# Patient Record
Sex: Female | Born: 1990 | Race: White | Hispanic: No | Marital: Single | Smoking: Current every day smoker
Health system: Southern US, Community
[De-identification: ages and names within clinical notes are randomized; demographics above are authoritative.]

## PROBLEM LIST (undated history)

## (undated) DIAGNOSIS — N83209 Unspecified ovarian cyst, unspecified side: Secondary | ICD-10-CM

---

## 2015-02-12 ENCOUNTER — Emergency Department (HOSPITAL_COMMUNITY): Payer: Managed Care, Other (non HMO)

## 2015-02-12 ENCOUNTER — Encounter (HOSPITAL_COMMUNITY): Payer: Self-pay | Admitting: Emergency Medicine

## 2015-02-12 ENCOUNTER — Emergency Department (HOSPITAL_COMMUNITY)
Admission: EM | Admit: 2015-02-12 | Discharge: 2015-02-12 | Disposition: A | Discharge status: Home / Self Care | Payer: Managed Care, Other (non HMO) | Attending: Emergency Medicine | Admitting: Emergency Medicine

## 2015-02-12 DIAGNOSIS — F17210 Nicotine dependence, cigarettes, uncomplicated: Secondary | ICD-10-CM | POA: Insufficient documentation

## 2015-02-12 DIAGNOSIS — N83201 Unspecified ovarian cyst, right side: Secondary | ICD-10-CM

## 2015-02-12 DIAGNOSIS — N83202 Unspecified ovarian cyst, left side: Secondary | ICD-10-CM

## 2015-02-12 DIAGNOSIS — O34591 Maternal care for other abnormalities of gravid uterus, first trimester: Secondary | ICD-10-CM | POA: Insufficient documentation

## 2015-02-12 DIAGNOSIS — Z3200 Encounter for pregnancy test, result unknown: Secondary | ICD-10-CM

## 2015-02-12 DIAGNOSIS — Z3A01 Less than 8 weeks gestation of pregnancy: Secondary | ICD-10-CM | POA: Diagnosis not present

## 2015-02-12 DIAGNOSIS — N8320 Unspecified ovarian cysts: Secondary | ICD-10-CM | POA: Insufficient documentation

## 2015-02-12 DIAGNOSIS — R102 Pelvic and perineal pain: Secondary | ICD-10-CM

## 2015-02-12 DIAGNOSIS — Z79899 Other long term (current) drug therapy: Secondary | ICD-10-CM | POA: Insufficient documentation

## 2015-02-12 DIAGNOSIS — O99331 Smoking (tobacco) complicating pregnancy, first trimester: Secondary | ICD-10-CM | POA: Insufficient documentation

## 2015-02-12 DIAGNOSIS — O9989 Other specified diseases and conditions complicating pregnancy, childbirth and the puerperium: Secondary | ICD-10-CM | POA: Insufficient documentation

## 2015-02-12 HISTORY — DX: Unspecified ovarian cyst, unspecified side: N83.209

## 2015-02-12 LAB — URINALYSIS, ROUTINE W REFLEX MICROSCOPIC
Bilirubin Urine: NEGATIVE
Glucose, UA: NEGATIVE mg/dL
Hgb urine dipstick: NEGATIVE
Ketones, ur: NEGATIVE mg/dL
Leukocytes, UA: NEGATIVE
Nitrite: NEGATIVE
Protein, ur: NEGATIVE mg/dL
Specific Gravity, Urine: 1.022 (ref 1.005–1.030)
Urobilinogen, UA: 0.2 mg/dL (ref 0.0–1.0)
pH: 8 (ref 5.0–8.0)

## 2015-02-12 LAB — CBC WITH DIFFERENTIAL/PLATELET
Basophils Absolute: 0 10*3/uL (ref 0.0–0.1)
Basophils Relative: 0 % (ref 0–1)
Eosinophils Absolute: 0.3 10*3/uL (ref 0.0–0.7)
Eosinophils Relative: 4 % (ref 0–5)
HCT: 40.1 % (ref 36.0–46.0)
Hemoglobin: 13.3 g/dL (ref 12.0–15.0)
Lymphocytes Relative: 30 % (ref 12–46)
Lymphs Abs: 2.2 10*3/uL (ref 0.7–4.0)
MCH: 31.4 pg (ref 26.0–34.0)
MCHC: 33.2 g/dL (ref 30.0–36.0)
MCV: 94.6 fL (ref 78.0–100.0)
Monocytes Absolute: 0.5 10*3/uL (ref 0.1–1.0)
Monocytes Relative: 6 % (ref 3–12)
Neutro Abs: 4.4 10*3/uL (ref 1.7–7.7)
Neutrophils Relative %: 60 % (ref 43–77)
Platelets: 203 10*3/uL (ref 150–400)
RBC: 4.24 MIL/uL (ref 3.87–5.11)
RDW: 12.8 % (ref 11.5–15.5)
WBC: 7.4 10*3/uL (ref 4.0–10.5)

## 2015-02-12 LAB — POC URINE PREG, ED: Preg Test, Ur: POSITIVE — AB

## 2015-02-12 LAB — COMPREHENSIVE METABOLIC PANEL
ALT: 19 U/L (ref 0–35)
AST: 17 U/L (ref 0–37)
Albumin: 4.4 g/dL (ref 3.5–5.2)
Alkaline Phosphatase: 46 U/L (ref 39–117)
Anion gap: 4 — ABNORMAL LOW (ref 5–15)
BUN: 13 mg/dL (ref 6–23)
CO2: 28 mmol/L (ref 19–32)
Calcium: 8.8 mg/dL (ref 8.4–10.5)
Chloride: 108 mmol/L (ref 96–112)
Creatinine, Ser: 0.51 mg/dL (ref 0.50–1.10)
GFR calc Af Amer: >90 mL/min (ref >90)
GFR calc non Af Amer: >90 mL/min (ref >90)
Glucose, Bld: 103 mg/dL — ABNORMAL HIGH (ref 70–99)
Potassium: 4.2 mmol/L (ref 3.5–5.1)
Sodium: 140 mmol/L (ref 135–145)
Total Bilirubin: 1.1 mg/dL (ref 0.3–1.2)
Total Protein: 6.8 g/dL (ref 6.0–8.3)

## 2015-02-12 LAB — WET PREP, GENITAL
Clue Cells Wet Prep HPF POC: NONE SEEN
Trich, Wet Prep: NONE SEEN
Yeast Wet Prep HPF POC: NONE SEEN

## 2015-02-12 LAB — HCG, QUANTITATIVE, PREGNANCY

## 2015-02-12 LAB — I-STAT BETA HCG BLOOD, ED (MC, WL, AP ONLY): I-stat hCG, quantitative: <5 m[IU]/mL (ref <5)

## 2015-02-12 LAB — LIPASE, BLOOD: Lipase: 25 U/L (ref 11–59)

## 2015-02-12 MED ORDER — HYDROCODONE-ACETAMINOPHEN 5-325 MG PO TABS: 1.0000 | ORAL_TABLET | Freq: Four times a day (QID) | ORAL | Status: DC | PRN

## 2015-02-12 MED ORDER — HYDROCODONE-ACETAMINOPHEN 5-325 MG PO TABS
1.0000 | ORAL_TABLET | Freq: Once | ORAL | Status: AC
  Administered 2015-02-12: 1 via ORAL
  Filled 2015-02-12: qty 1

## 2015-02-12 MED ORDER — METOCLOPRAMIDE HCL 5 MG/ML IJ SOLN
10.0000 mg | Freq: Once | INTRAMUSCULAR | Status: AC
  Administered 2015-02-12: 10 mg via INTRAMUSCULAR
  Filled 2015-02-12: qty 2

## 2015-02-12 NOTE — ED Notes (Signed)
Patient is crying, anxious, and nervous. She is feels overwhelmed about her current condition.

## 2015-02-12 NOTE — ED Notes (Signed)
Patient Dxed with ovarian cysts, c/o low abdomen pain without relief from prescribed medication.

## 2015-02-12 NOTE — ED Notes (Signed)
Patients family member has come up to the nursing desk concerning of when provider was going to be reassess patient. Informed him that the provider was still in another room suturing.

## 2015-02-12 NOTE — ED Notes (Signed)
Patient called out reporting she is vomiting. Notified provider. She would like to look at ultrasound results before she medicates. Will inform patient.

## 2015-02-12 NOTE — ED Notes (Signed)
Ultrasound at bedside

## 2015-02-12 NOTE — ED Provider Notes (Signed)
CSN: [id number]     Arrival date & time 02/12/15  1418 History   First MD Initiated Contact with Patient 02/12/15 1653     Chief Complaint  Patient presents with  . Abdominal Pain     (Consider location/radiation/quality/duration/timing/severity/associated sxs/prior Treatment) The history is provided by the patient. No language interpreter was used.  Heidi Peck is a 24 year old female with past medical history ovarian cysts presenting to the ED with abdominal pain that has been ongoing for the past 2-3 months with worsening over the past 2 weeks. Patient reported that in December 2015 she had an ultrasound performed that identified cysts bilaterally, patient was prescribed naproxen 500 mg and was recommended to come back in 4 months for reassessment-reported ultrasound was performed in [facility]. Patient reported that for over the past 2 weeks her right pelvic region hurts more than her left. Reported that the pain varies as a sharp, burning sensation with radiation towards the back. Reported intermittent feelings of nausea. Reported that her last menstrual. 12/23/2014, reported that she is 2 weeks late and normally has irregular cycles. Denied birth control. Reported that she is sexually active, reported using protection-condoms. Denied fever, chills, chest pain, shortness of breath, difficulty breathing, vomiting, vaginal discharge, vaginal bleeding, dizziness, travels, dizziness, headaches, changes to eating habits. PCP none  Past Medical History  Diagnosis Date  . Ovarian cyst    History reviewed. No pertinent past surgical history. No family history on file. History  Substance Use Topics  . Smoking status: Current Every Day Smoker    Types: Cigarettes  . Smokeless tobacco: Not on file  . Alcohol Use: Yes     Comment: occasional   OB History    No data available     Review of Systems  Constitutional: Negative for fever and chills.  Respiratory: Negative for  chest tightness and shortness of breath.   Cardiovascular: Negative for chest pain.  Gastrointestinal: Positive for nausea. Negative for vomiting, abdominal pain, diarrhea, constipation, blood in stool and anal bleeding.  Genitourinary: Positive for pelvic pain. Negative for dysuria, hematuria, vaginal bleeding, vaginal discharge and vaginal pain.  Musculoskeletal: Positive for back pain. Negative for neck pain and neck stiffness.  Neurological: Negative for dizziness, weakness and headaches.      Allergies  Sulfa antibiotics  Home Medications   Prior to Admission medications   Medication Sig Start Date End Date Taking? Authorizing Provider  citalopram (CELEXA) 40 MG tablet Take 40 mg by mouth daily.   Yes Historical Provider, MD  clonazePAM (KLONOPIN) 0.5 MG tablet Take 0.25 mg by mouth 2 (two) times daily as needed for anxiety (anxiety).   Yes Historical Provider, MD  Multiple Vitamins-Minerals (MULTIVITAMIN & MINERAL PO) Take 1 tablet by mouth daily.   Yes Historical Provider, MD  naproxen (NAPROSYN) 500 MG tablet Take 500 mg by mouth every 12 (twelve) hours.   Yes Historical Provider, MD  HYDROcodone-acetaminophen (NORCO/VICODIN) 5-325 MG per tablet Take 1 tablet by mouth every 6 (six) hours as needed for moderate pain or severe pain. 02/12/15   Hasan Douse, PA-C   BP 135/67 mmHg  Pulse 71  Temp(Src) 98.9 F (37.2 C) (Oral)  Resp 16  SpO2 99% Physical Exam  Constitutional: She is oriented to person, place, and time. She appears well-developed and well-nourished. No distress.  HENT:  Head: Normocephalic and atraumatic.  Eyes: Conjunctivae and EOM are normal. Right eye exhibits no discharge. Left eye exhibits no discharge.  Neck: Normal range of motion. Neck supple.  No tracheal deviation present.  Cardiovascular: Normal rate, regular rhythm and normal heart sounds.  Exam reveals no friction rub.   No murmur heard. Pulses:      Radial pulses are 2+ on the right side, and 2+  on the left side.  Pulmonary/Chest: Effort normal and breath sounds normal. No respiratory distress. She has no wheezes. She has no rales.  Abdominal: Soft. Bowel sounds are normal. She exhibits no distension. There is tenderness in the right lower quadrant, suprapubic area and left lower quadrant. There is no rigidity, no rebound, no guarding, no tenderness at McBurney's point and negative Murphy's sign.  Genitourinary:  Pelvic Exam: Negative swelling, erythema, inflammation, lesions, sores, deformities. Negative masses. Negative bright red blood in the vaginal vault. Thick opaque discharge noted without odor. Negative CMT. Right adnexal tenderness noted. Negative left adnexal tenderness.  Exam chaperoned with tech.  Musculoskeletal: Normal range of motion.  Lymphadenopathy:    She has no cervical adenopathy.  Neurological: She is alert and oriented to person, place, and time. No cranial nerve deficit. She exhibits normal muscle tone. Coordination normal.  Skin: Skin is warm and dry. No rash noted. She is not diaphoretic. No erythema.  Psychiatric: She has a normal mood and affect. Her behavior is normal. Thought content normal.  Nursing note and vitals reviewed.   ED Course  Procedures (including critical care time)   Results for orders placed or performed during the hospital encounter of 02/12/15  Wet prep, genital  Result Value Ref Range   Yeast Wet Prep HPF POC NONE SEEN NONE SEEN   Trich, Wet Prep NONE SEEN NONE SEEN   Clue Cells Wet Prep HPF POC NONE SEEN NONE SEEN   WBC, Wet Prep HPF POC FEW (A) NONE SEEN  CBC with Differential  Result Value Ref Range   WBC 7.4 4.0 - 10.5 K/uL   RBC 4.24 3.87 - 5.11 MIL/uL   Hemoglobin 13.3 12.0 - 15.0 g/dL   HCT 16.140.1 09.636.0 - 04.546.0 %   MCV 94.6 78.0 - 100.0 fL   MCH 31.4 26.0 - 34.0 pg   MCHC 33.2 30.0 - 36.0 g/dL   RDW 40.912.8 81.111.5 - 91.415.5 %   Platelets 203 150 - 400 K/uL   Neutrophils Relative % 60 43 - 77 %   Neutro Abs 4.4 1.7 - 7.7 K/uL    Lymphocytes Relative 30 12 - 46 %   Lymphs Abs 2.2 0.7 - 4.0 K/uL   Monocytes Relative 6 3 - 12 %   Monocytes Absolute 0.5 0.1 - 1.0 K/uL   Eosinophils Relative 4 0 - 5 %   Eosinophils Absolute 0.3 0.0 - 0.7 K/uL   Basophils Relative 0 0 - 1 %   Basophils Absolute 0.0 0.0 - 0.1 K/uL  Comprehensive metabolic panel  Result Value Ref Range   Sodium 140 135 - 145 mmol/L   Potassium 4.2 3.5 - 5.1 mmol/L   Chloride 108 96 - 112 mmol/L   CO2 28 19 - 32 mmol/L   Glucose, Bld 103 (H) 70 - 99 mg/dL   BUN 13 6 - 23 mg/dL   Creatinine, Ser 7.820.51 0.50 - 1.10 mg/dL   Calcium 8.8 8.4 - 95.610.5 mg/dL   Total Protein 6.8 6.0 - 8.3 g/dL   Albumin 4.4 3.5 - 5.2 g/dL   AST 17 0 - 37 U/L   ALT 19 0 - 35 U/L   Alkaline Phosphatase 46 39 - 117 U/L   Total Bilirubin 1.1 0.3 -  1.2 mg/dL   GFR calc non Af Amer >90 >90 mL/min   GFR calc Af Amer >90 >90 mL/min   Anion gap 4 (L) 5 - 15  Lipase, blood  Result Value Ref Range   Lipase 25 11 - 59 U/L  Urinalysis, Routine w reflex microscopic  Result Value Ref Range   Color, Urine YELLOW YELLOW   APPearance CLEAR CLEAR   Specific Gravity, Urine 1.022 1.005 - 1.030   pH 8.0 5.0 - 8.0   Glucose, UA NEGATIVE NEGATIVE mg/dL   Hgb urine dipstick NEGATIVE NEGATIVE   Bilirubin Urine NEGATIVE NEGATIVE   Ketones, ur NEGATIVE NEGATIVE mg/dL   Protein, ur NEGATIVE NEGATIVE mg/dL   Urobilinogen, UA 0.2 0.0 - 1.0 mg/dL   Nitrite NEGATIVE NEGATIVE   Leukocytes, UA NEGATIVE NEGATIVE  hCG, quantitative, pregnancy  Result Value Ref Range   hCG, Beta Chain, Quant, S <1 <5 mIU/mL  POC Urine Pregnancy, ED  (If Pre-menopausal female) - do not order at [facility]  Result Value Ref Range   Preg Test, Ur POSITIVE (A) NEGATIVE  I-Stat Beta hCG blood, ED ([facility], [facility], [facility] only)  Result Value Ref Range   I-stat hCG, quantitative <5.0 <5 mIU/mL   Comment 3            Labs Review Labs Reviewed  WET PREP, GENITAL - Abnormal; Notable for the following:    WBC, Wet Prep HPF POC FEW (*)     All other components within normal limits  COMPREHENSIVE METABOLIC PANEL - Abnormal; Notable for the following:    Glucose, Bld 103 (*)    Anion gap 4 (*)    All other components within normal limits  POC URINE PREG, ED - Abnormal; Notable for the following:    Preg Test, Ur POSITIVE (*)    All other components within normal limits  CBC WITH DIFFERENTIAL/PLATELET  LIPASE, BLOOD  URINALYSIS, ROUTINE W REFLEX MICROSCOPIC  HCG, QUANTITATIVE, PREGNANCY  I-STAT BETA HCG BLOOD, ED ([facility], [facility], [facility] ONLY)  GC/CHLAMYDIA PROBE AMP ([place])    Imaging Review US Ob Comp Less 14 Wks  02/12/2015   CLINICAL DATA:  Pelvic pain mostly in the left lower quadrant. Positive urine pregnancy test. Quantitative beta HCG is less than 1. Estimated gestational age by LMP is 7 weeks 1 day.  EXAM: OBSTETRIC <14 WK [place] AND TRANSVAGINAL OB  US DOPPLER ULTRASOUND OF OVARIES  TECHNIQUE: Both transabdominal and transvaginal ultrasound examinations were performed for complete evaluation of the gestation as well as the maternal uterus, adnexal regions, and pelvic cul-de-sac. Transvaginal technique was performed to assess early pregnancy.  Color and duplex Doppler ultrasound was utilized to evaluate blood flow to the ovaries.  COMPARISON:  None.  FINDINGS: Intrauterine gestational sac: No intrauterine pregnancy identified.  Yolk sac:  Not identified.  Embryo:  Not identified.  Cardiac Activity: Not identified.  Maternal uterus/adnexae: Uterus is retroflexed. No myometrial mass lesions. Endometrial stripe thickness measures 1.1 cm. Normal appearance.  Right ovary measures 3.2 x 2 x 2 cm. Normal follicular changes. Focal complex lesion measuring about 1.4 cm diameter probably represents an involuting cyst.  Left ovary measures 3.4 x 2.1 x 2.3 cm. There is a septated cyst in the left adnexae adjacent to the ovary measuring 4.2 x 3.1 x 3.8 cm. Thin septations are demonstrated without nodularity or flow. This likely represents a  paraovarian cyst. No suspicious features for malignancy.  Pulsed Doppler evaluation of both ovaries demonstrates normal appearing low-resistance arterial and venous  waveforms. Flow is demonstrated in both ovaries on color flow Doppler imaging.  Small amount of free fluid in the pelvis.  IMPRESSION: No intrauterine gestational sac, yolk sac, or fetal pole identified. Differential considerations include intrauterine pregnancy too early to be sonographically visualized, missed abortion, or ectopic pregnancy. Correlate with serial quantitative beta HCG levels. Followup ultrasound is recommended in 10-14 days for further evaluation if indicated. No evidence of ovarian torsion. Septated paraovarian cyst on the left without specific malignant features.   Electronically Signed   By: Burman Nieves M.D.   On: 02/12/2015 21:07   US Ob Transvaginal  02/12/2015   CLINICAL DATA:  Pelvic pain mostly in the left lower quadrant. Positive urine pregnancy test. Quantitative beta HCG is less than 1. Estimated gestational age by LMP is 7 weeks 1 day.  EXAM: OBSTETRIC <14 WK [place] AND TRANSVAGINAL OB  US DOPPLER ULTRASOUND OF OVARIES  TECHNIQUE: Both transabdominal and transvaginal ultrasound examinations were performed for complete evaluation of the gestation as well as the maternal uterus, adnexal regions, and pelvic cul-de-sac. Transvaginal technique was performed to assess early pregnancy.  Color and duplex Doppler ultrasound was utilized to evaluate blood flow to the ovaries.  COMPARISON:  None.  FINDINGS: Intrauterine gestational sac: No intrauterine pregnancy identified.  Yolk sac:  Not identified.  Embryo:  Not identified.  Cardiac Activity: Not identified.  Maternal uterus/adnexae: Uterus is retroflexed. No myometrial mass lesions. Endometrial stripe thickness measures 1.1 cm. Normal appearance.  Right ovary measures 3.2 x 2 x 2 cm. Normal follicular changes. Focal complex lesion measuring about 1.4 cm diameter probably  represents an involuting cyst.  Left ovary measures 3.4 x 2.1 x 2.3 cm. There is a septated cyst in the left adnexae adjacent to the ovary measuring 4.2 x 3.1 x 3.8 cm. Thin septations are demonstrated without nodularity or flow. This likely represents a paraovarian cyst. No suspicious features for malignancy.  Pulsed Doppler evaluation of both ovaries demonstrates normal appearing low-resistance arterial and venous waveforms. Flow is demonstrated in both ovaries on color flow Doppler imaging.  Small amount of free fluid in the pelvis.  IMPRESSION: No intrauterine gestational sac, yolk sac, or fetal pole identified. Differential considerations include intrauterine pregnancy too early to be sonographically visualized, missed abortion, or ectopic pregnancy. Correlate with serial quantitative beta HCG levels. Followup ultrasound is recommended in 10-14 days for further evaluation if indicated. No evidence of ovarian torsion. Septated paraovarian cyst on the left without specific malignant features.   Electronically Signed   By: Burman Nieves M.D.   On: 02/12/2015 21:07   [place] Art/ven Flow Abd Pelv Doppler  02/12/2015   CLINICAL DATA:  Pelvic pain mostly in the left lower quadrant. Positive urine pregnancy test. Quantitative beta HCG is less than 1. Estimated gestational age by LMP is 7 weeks 1 day.  EXAM: OBSTETRIC <14 WK [place] AND TRANSVAGINAL OB  US DOPPLER ULTRASOUND OF OVARIES  TECHNIQUE: Both transabdominal and transvaginal ultrasound examinations were performed for complete evaluation of the gestation as well as the maternal uterus, adnexal regions, and pelvic cul-de-sac. Transvaginal technique was performed to assess early pregnancy.  Color and duplex Doppler ultrasound was utilized to evaluate blood flow to the ovaries.  COMPARISON:  None.  FINDINGS: Intrauterine gestational sac: No intrauterine pregnancy identified.  Yolk sac:  Not identified.  Embryo:  Not identified.  Cardiac Activity: Not identified.   Maternal uterus/adnexae: Uterus is retroflexed. No myometrial mass lesions. Endometrial stripe thickness measures 1.1 cm. Normal appearance.  Right  ovary measures 3.2 x 2 x 2 cm. Normal follicular changes. Focal complex lesion measuring about 1.4 cm diameter probably represents an involuting cyst.  Left ovary measures 3.4 x 2.1 x 2.3 cm. There is a septated cyst in the left adnexae adjacent to the ovary measuring 4.2 x 3.1 x 3.8 cm. Thin septations are demonstrated without nodularity or flow. This likely represents a paraovarian cyst. No suspicious features for malignancy.  Pulsed Doppler evaluation of both ovaries demonstrates normal appearing low-resistance arterial and venous waveforms. Flow is demonstrated in both ovaries on color flow Doppler imaging.  Small amount of free fluid in the pelvis.  IMPRESSION: No intrauterine gestational sac, yolk sac, or fetal pole identified. Differential considerations include intrauterine pregnancy too early to be sonographically visualized, missed abortion, or ectopic pregnancy. Correlate with serial quantitative beta HCG levels. Followup ultrasound is recommended in 10-14 days for further evaluation if indicated. No evidence of ovarian torsion. Septated paraovarian cyst on the left without specific malignant features.   Electronically Signed   By: Burman Nieves M.D.   On: 02/12/2015 21:07     EKG Interpretation None      9:41 PM This provider discussed with attending and reviewed labs and imaging in great detail. As per attending, reported that patient is not pregnant and that pain medications can be administered.   MDM   Final diagnoses:  Possible pregnancy  Pelvic pain in female  Bilateral ovarian cysts    Medications  metoCLOPramide (REGLAN) injection 10 mg (10 mg Intramuscular Given 02/12/15 2140)  HYDROcodone-acetaminophen (NORCO/VICODIN) 5-325 MG per tablet 1 tablet (1 tablet Oral Given 02/12/15 2146)    Filed Vitals:   02/12/15 1658 02/12/15  1837 02/12/15 2009 02/12/15 2146  BP: 114/62 111/54 141/93 135/67  Pulse: 56 58 60 71  Temp: 98.1 F (36.7 C)  98.9 F (37.2 C)   TempSrc: Oral  Oral   Resp: SpO2: 99% 97% 98% 99%   CBC unremarkable-negative elevated leukocytosis. Hemoglobin 13.3, hematocrit 40.1. CMP noted glucose of 103, negative elevated anion gap. Lipase negative elevation. Urinalysis unremarkable-negative findings of hemoglobin, nitrites, leukocytes. Urine pregnancy positive. Beta hCG <1. Wet prep noted no yeast, Trichomonas or clue cells-a few white blood cells noted. Pelvic ultrasounds no intrauterine gestational sac identified, yolk sac or fetal pole identified. Recommended follow-up in about 10-14 days, beta-hCG levels. No findings of ovarian torsion. Septated paraovarian cyst on the left without specific malignant features. Negative findings of ovarian torsion. Involuting cyst noted on the right ovary and septated cyst noted on the left ovary-patient is history of ovarian cysts. Doubt pregnancy secondary to i-STAT beta-hCG and quantitative beta hCG negative. Ultrasound negative for intrauterine gestational sac, yolk or fetal pole. Patient given medications in the ED setting. Patient feeling better and able to tolerate fluids PO without difficulty - negative episodes of emesis while in the ED setting. Discussed case in great detail with attending physician, Dr. Marylen Ponto - as per physician reported that patient is not pregnant and reported that pain medications can be administered. Patient has history of ovarian cyst diagnosed in December 2015 with worsening pain over the past 2 weeks bilaterally, right more so than left. Suspicion to be ovarian cyst discomfort. Patient stable, afebrile. Patient not septic appearing. Discharged patient. Discharged patient with pain medications - discussed course, precautions, disposal technique. Referred patient to OBGYN. Recommended patient to re-assess urine pregnancy within 10  days. Discussed with patient to closely monitor symptoms and if symptoms are  to worsen or change to report back to the ED - strict return instructions given.  Patient agreed to plan of care, understood, all questions answered.   Raymon Mutton, PA-C 02/12/15 2213  Raeford Razor, MD 02/15/15 1049

## 2015-02-12 NOTE — Discharge Instructions (Signed)
Please call your doctor for a followup appointment within 24-48 hours. When you talk to your doctor please let them know that you were seen in the emergency department and have them acquire all of your records so that they can discuss the findings with you and formulate a treatment plan to fully care for your new and ongoing problems. Please keep appointment with your OB/GYN on Wednesday Please rest and stay hydrated Please repeat urine pregnancy within 10 days Please take medications as prescribed - while on pain medications there is to be no drinking alcohol, driving, operating any heavy machinery. If extra please dispose in a proper manner. Please do not take any extra Tylenol with this medication for this can lead to Tylenol overdose and liver issues.  Please continue to monitor symptoms closely and if symptoms are to worsen or change (fever greater than 101, chills, sweating, nausea, vomiting, chest pain, shortness of breathe, difficulty breathing, weakness, numbness, tingling, worsening or changes to pain pattern, fall, injury, abnormal vaginal bleeding, vaginal discomfort or discharge, focalized pain to the right lower quadrant of the abdomen) please report back to the Emergency Department immediately.   Ovarian Cyst An ovarian cyst is a fluid-filled sac that forms on an ovary. The ovaries are small organs that produce eggs in women. Various types of cysts can form on the ovaries. Most are not cancerous. Many do not cause problems, and they often go away on their own. Some may cause symptoms and require treatment. Common types of ovarian cysts include:  Functional cysts--These cysts may occur every month during the menstrual cycle. This is normal. The cysts usually go away with the next menstrual cycle if the woman does not get pregnant. Usually, there are no symptoms with a functional cyst.  Endometrioma cysts--These cysts form from the tissue that lines the uterus. They are also called  "chocolate cysts" because they become filled with blood that turns brown. This type of cyst can cause pain in the lower abdomen during intercourse and with your menstrual period.  Cystadenoma cysts--This type develops from the cells on the outside of the ovary. These cysts can get very big and cause lower abdomen pain and pain with intercourse. This type of cyst can twist on itself, cut off its blood supply, and cause severe pain. It can also easily rupture and cause a lot of pain.  Dermoid cysts--This type of cyst is sometimes found in both ovaries. These cysts may contain different kinds of body tissue, such as skin, teeth, hair, or cartilage. They usually do not cause symptoms unless they get very big.  Theca lutein cysts--These cysts occur when too much of a certain hormone (human chorionic gonadotropin) is produced and overstimulates the ovaries to produce an egg. This is most common after procedures used to assist with the conception of a baby (in vitro fertilization). CAUSES   Fertility drugs can cause a condition in which multiple large cysts are formed on the ovaries. This is called ovarian hyperstimulation syndrome.  A condition called polycystic ovary syndrome can cause hormonal imbalances that can lead to nonfunctional ovarian cysts. SIGNS AND SYMPTOMS  Many ovarian cysts do not cause symptoms. If symptoms are present, they may include:  Pelvic pain or pressure.  Pain in the lower abdomen.  Pain during sexual intercourse.  Increasing girth (swelling) of the abdomen.  Abnormal menstrual periods.  Increasing pain with menstrual periods.  Stopping having menstrual periods without being pregnant. DIAGNOSIS  These cysts are commonly found during a routine  or annual pelvic exam. Tests may be ordered to find out more about the cyst. These tests may include:  Ultrasound.  X-ray of the pelvis.  CT scan.  MRI.  Blood tests. TREATMENT  Many ovarian cysts go away on their own  without treatment. Your health care provider may want to check your cyst regularly for 2-3 months to see if it changes. For women in menopause, it is particularly important to monitor a cyst closely because of the higher rate of ovarian cancer in menopausal women. When treatment is needed, it may include any of the following:  A procedure to drain the cyst (aspiration). This may be done using a long needle and ultrasound. It can also be done through a laparoscopic procedure. This involves using a thin, lighted tube with a tiny camera on the end (laparoscope) inserted through a small incision.  Surgery to remove the whole cyst. This may be done using laparoscopic surgery or an open surgery involving a larger incision in the lower abdomen.  Hormone treatment or birth control pills. These methods are sometimes used to help dissolve a cyst. HOME CARE INSTRUCTIONS   Only take over-the-counter or prescription medicines as directed by your health care provider.  Follow up with your health care provider as directed.  Get regular pelvic exams and Pap tests. SEEK MEDICAL CARE IF:   Your periods are late, irregular, or painful, or they stop.  Your pelvic pain or abdominal pain does not go away.  Your abdomen becomes larger or swollen.  You have pressure on your bladder or trouble emptying your bladder completely.  You have pain during sexual intercourse.  You have feelings of fullness, pressure, or discomfort in your stomach.  You lose weight for no apparent reason.  You feel generally ill.  You become constipated.  You lose your appetite.  You develop acne.  You have an increase in body and facial hair.  You are gaining weight, without changing your exercise and eating habits.  You think you are pregnant. SEEK IMMEDIATE MEDICAL CARE IF:   You have increasing abdominal pain.  You feel sick to your stomach (nauseous), and you throw up (vomit).  You develop a fever that comes on  suddenly.  You have abdominal pain during a bowel movement.  Your menstrual periods become heavier than usual. MAKE SURE YOU:  Understand these instructions.  Will watch your condition.  Will get help right away if you are not doing well or get worse. Document Released: 12/08/2005 Document Revised: 12/13/2013 Document Reviewed: 08/15/2013 [facility] Patient Information 2015 [place], [place]. This information is not intended to replace advice given to you by your health care provider. Make sure you discuss any questions you have with your health care provider.   Emergency Department Resource Guide 1) Find a Doctor and Pay Out of Pocket Although you won't have to find out who is covered by your insurance plan, it is a good idea to ask around and get recommendations. You will then need to call the office and see if the doctor you have chosen will accept you as a new patient and what types of options they offer for patients who are self-pay. Some doctors offer discounts or will set up payment plans for their patients who do not have insurance, but you will need to ask so you aren't surprised when you get to your appointment.  2) Contact Your Local Health Department Not all health departments have doctors that can see patients for sick visits, but  many do, so it is worth a call to see if yours does. If you don't know where your local health department is, you can check in your phone book. The CDC also has a tool to help you locate your state's health department, and many state websites also have listings of all of their local health departments.  3) Find a Walk-in Clinic If your illness is not likely to be very severe or complicated, you may want to try a walk in clinic. These are popping up all over the country in pharmacies, drugstores, and shopping centers. They're usually staffed by nurse practitioners or physician assistants that have been trained to treat common illnesses and complaints.  They're usually fairly quick and inexpensive. However, if you have serious medical issues or chronic medical problems, these are probably not your best option.  No Primary Care Doctor: - Call Health Connect at  [phone] - they can help you locate a primary care doctor that  accepts your insurance, provides certain services, etc. - Physician Referral Service- [phone]  Chronic Pain Problems: Organization         Address  Phone   Notes  [facility]  [phone] Patients need to be referred by their primary care doctor.   Medication Assistance: Organization         Address  Phone   Notes  [facility] [address] [place], [place] [address] [phone] --Must be a resident of [facility] -- Must have NO insurance coverage whatsoever (no Medicaid/ Medicare, etc.) -- The pt. MUST have a primary care doctor that directs their care regularly and follows them in the community   MedAssist  [phone]   [employer]  [phone]    Agencies that provide inexpensive medical care: Organization         Address  Phone   Notes  [facility]  [phone]   [facility]    [phone]   [facility] [address] [place], [place] [address] [phone]   [facility] [address] [place] [place] [phone]   Planned Parenthood    [phone]   [facility]    [phone]   Community Health and [facility]  [address], [place] Phone:  [phone], Fax:  [phone] Hours of Operation:  9 am - 6 pm, M-F.  Also accepts Medicaid/Medicare and self-pay.  [facility]  [address], [place] Phone: [phone], Fax: [phone]. Hours of Operation:  8:30 am - 5:30 pm, M-F.  Also accepts Medicaid and self-pay.  [facility] [place] [address], [place]  Phone: [phone]   [facility] [address] Natasha Bence [place], [place] [phone], Ext. [phone] Mondays & Thursdays: 7-9 AM.  First 15 patients are seen on a first come, first serve basis.    Medicaid-accepting [facility] Providers:  Organization         Address  Phone   Notes  [facility] [address], [place] [phone] Also accepts self-pay patients.  [facility] [address] Laurell Josephs [place], [place]  [phone]   [facility] [address], [place] [phone]   [facility] [address], [place] [phone]   Adrian Saran  Bland [address], [place]   [phone] Only accepts [place] patients after they have their name applied to their card.   Self-Pay (no insurance) in [facility]:  Organization         Address  Phone   Notes  Sickle Cell Patients, [facility] [address] [place], [place] [phone]   [facility] [address] [place], [place] [phone]   [facility]  [address], [place] [phone]   [facility]/Dr. Osei-Bonsu  [address], [place] or [address], [place] [phone] Phone number for both [place] and [place] locations is the same.  [facility]gent Medical and [facility] [address], [place] [phone]   [facility] [address], [place] or [address] [phone]   [facility] [address], [place] [phone], phone; [phone], fax Sees patients 1st and 3rd Saturday of every month.  Must not qualify for public or private insurance (i.e. Medicaid, Medicare, [place] Health Choice, Veterans' Benefits)  Household income should be no more than 200% of the poverty level The clinic cannot  treat you if you are pregnant or think you are pregnant  Sexually transmitted diseases are not treated at the clinic.    Dental Care: Organization         Address  Phone  Notes  [facility] Department of [facility] [address] [place], [place] [phone] Accepts children up to age 23 who are enrolled in [place] or [place] Health Choice; pregnant women with a Medicaid card; and children who have applied for Medicaid or [place] Health Choice, but were declined, whose parents can pay a reduced fee at time of service.  [facility] Department of [facility]  [address], [place] [phone] Accepts children up to age 37 who are enrolled in [place] or [place] Health Choice; pregnant women with a Medicaid card; and children who have applied for Medicaid or [place] Health Choice, but were declined, whose parents can pay a reduced fee at time of service.  [facility]  [address] [place], [place] [phone] Patients are seen by appointment only. Walk-ins are not accepted. [facility] will see patients 55 years of age and older. Monday - Tuesday (8am-5pm) Most Wednesdays (8:30-5pm) $30 per visit, cash only  [facility]  [address], [facility] [phone] Patients are seen by appointment only. Walk-ins are not accepted. [facility] will see patients 77 years of age and older. One Wednesday Evening (Monthly: Volunteer Based).  $30 per visit, cash only  [employer]  [phone] for adults; Children under age 50, call Graduate Pediatric Dentistry at [phone]. Children aged 73-14, please call [phone] to request a pediatric application.  Dental services are provided in all areas of dental care including fillings, crowns and bridges, complete and partial dentures, implants, gum treatment, root canals, and extractions. Preventive care is also provided.  Treatment is provided to both adults and children. Patients are selected via a lottery and there is often a waiting list.   [facility] [address], [place]  [phone] [email or web address]   [facility] [address] [place], [place] [phone], Ext. [phone]  Second and Fourth Thursday of each month, opens at 6:30 AM; Clinic ends at 9 AM.  Patients are seen on a first-come first-served basis, and a limited number are seen during each clinic.   [facility]  [address] Ether [place], [place] [phone]   Eligibility Requirements You must have lived in [place], [place], or [place] counties for at least the last three months.   You cannot be eligible for state or federal sponsored [place], including [employer], [place], or [employer].   You generally cannot be eligible for healthcare insurance through your employer.    How to apply: Eligibility screenings are held every Tuesday and Wednesday afternoon from 1:00 pm until 4:00 pm. You do not need an appointment for the interview!  [facility] [address], [place], [place] [phone]   [facility] Health Department  [phone]   [facility] Health Department  [phone]   [facility] Health Department  [phone]    Behavioral Health Resources in the Community: Intensive Outpatient Programs Organization         Address  Phone  Notes  [facility] [address] [place], [place] [phone]   [facility] [address], [place], [place] [phone]   [facility] [address], [place], [place]  [phone]   [facility] Mental Health [address]  [place], [place] [phone] or [phone]   Substance Abuse Resources Organization         Address  Phone  Notes  [facility] Drug Services  [phone]   [facility]   [phone]   [facility]  [phone]   Floydene Flock  [phone]   Residential & Outpatient Substance Abuse Program  [phone]   Psychological Services Organization         Address  Phone  Notes  [facility] Behavioral Health  [phone]   [facility] Services  [phone]   [facility] [address], [place] [phone] or [phone]    Mobile Crisis Teams Organization         Address  Phone  Notes  [facility], Mobile Crisis Care Unit  [phone]   [facility]  [address] [place], [place] [phone]   Doristine Locks [address] [place] [phone]    Self-Help/Support Groups Organization         Address  Phone             Notes  [facility] [place] - variety of support groups  [phone]- I7437963 Call for more information  Narcotics Anonymous (NA), [facility] [address], [employer] [place]  2 meetings at this location   Statistician         Address  Phone  Notes  [facility] [address],    [place]  [phone]   [facility]  [address], [place] [address], [place], [place] [phone]   [facility] [address] [place], [place] [phone] Admissions: 8am-3pm M-F  [facility] [address],    [place], [place] [phone]   [facility] [address] [place] Manns [place], [place] [phone]   [facility] [address]  [place] [employer], [place] [phone]   [facility] - Intensive Outpatient [address]ure[address], [place], [place] [phone]   [facility] ([facility] [phone]  [place], [place] [phone] or [phone]   [facility] ([facility]) [address] [place], [place] [phone] Accepts Medicaid  [facility] [address]  [place] [phone] Substance  Abuse/Addiction Treatment   [facility] Behavioral Health Resources Organization         Address  Phone  Notes  [facility]  [phone]) [phone]   Angie FavaJulie Brannon, PhD [address], [address] [place], [place]   [phone] or [phone]) [phone]   [facility]   [address] [place], [place] [phone]) [phone]   [facility] [address], [place], [place] [phone] Insurance/Medicaid/sponsorship through [facility]  [facility] [address]                                    [place], [place] [phone] Therapy/tele-psych/case  [facility] [address]   [place], [place] [phone]    Dr. Lolly MustacheArfeen  [phone]) [phone]   [facility]  [employer] [facility] 1) [address], [place] 2) [address], [place] 3)  [address], [place] [phone]  [phone](336) [phone]   [facility] Child Abuse Hotline [phone]) [phone] or [phone]) [phone] (After Hours)

## 2015-02-13 LAB — GC/CHLAMYDIA PROBE AMP (~~LOC~~) NOT AT ARMC
CHLAMYDIA, DNA PROBE: NEGATIVE
NEISSERIA GONORRHEA: NEGATIVE

## 2015-02-26 ENCOUNTER — Emergency Department (HOSPITAL_COMMUNITY)
Admission: EM | Admit: 2015-02-26 | Discharge: 2015-02-26 | Disposition: A | Discharge status: Home / Self Care | Payer: Managed Care, Other (non HMO) | Attending: Emergency Medicine | Admitting: Emergency Medicine

## 2015-02-26 ENCOUNTER — Encounter (HOSPITAL_COMMUNITY): Payer: Self-pay | Admitting: Emergency Medicine

## 2015-02-26 DIAGNOSIS — Z79899 Other long term (current) drug therapy: Secondary | ICD-10-CM | POA: Diagnosis not present

## 2015-02-26 DIAGNOSIS — Z720 Tobacco use: Secondary | ICD-10-CM | POA: Diagnosis not present

## 2015-02-26 DIAGNOSIS — Z3202 Encounter for pregnancy test, result negative: Secondary | ICD-10-CM | POA: Diagnosis not present

## 2015-02-26 DIAGNOSIS — N390 Urinary tract infection, site not specified: Secondary | ICD-10-CM | POA: Insufficient documentation

## 2015-02-26 DIAGNOSIS — R112 Nausea with vomiting, unspecified: Secondary | ICD-10-CM | POA: Diagnosis present

## 2015-02-26 LAB — CBC WITH DIFFERENTIAL/PLATELET
Basophils Absolute: 0 10*3/uL (ref 0.0–0.1)
Basophils Relative: 0 % (ref 0–1)
Eosinophils Absolute: 0 10*3/uL (ref 0.0–0.7)
Eosinophils Relative: 0 % (ref 0–5)
HCT: 41.4 % (ref 36.0–46.0)
HEMOGLOBIN: 14.1 g/dL (ref 12.0–15.0)
LYMPHS ABS: 1.7 10*3/uL (ref 0.7–4.0)
Lymphocytes Relative: 16 % (ref 12–46)
MCH: 31 pg (ref 26.0–34.0)
MCHC: 34.1 g/dL (ref 30.0–36.0)
MCV: 91 fL (ref 78.0–100.0)
MONOS PCT: 5 % (ref 3–12)
Monocytes Absolute: 0.5 10*3/uL (ref 0.1–1.0)
NEUTROS PCT: 79 % — AB (ref 43–77)
Neutro Abs: 8.1 10*3/uL — ABNORMAL HIGH (ref 1.7–7.7)
Platelets: 291 10*3/uL (ref 150–400)
RBC: 4.55 MIL/uL (ref 3.87–5.11)
RDW: 12.1 % (ref 11.5–15.5)
WBC: 10.3 10*3/uL (ref 4.0–10.5)

## 2015-02-26 LAB — URINE MICROSCOPIC-ADD ON

## 2015-02-26 LAB — COMPREHENSIVE METABOLIC PANEL
ALBUMIN: 4.9 g/dL (ref 3.5–5.2)
ALT: 16 U/L (ref 0–35)
ANION GAP: 12 (ref 5–15)
AST: 15 U/L (ref 0–37)
Alkaline Phosphatase: 42 U/L (ref 39–117)
BILIRUBIN TOTAL: 1.6 mg/dL — AB (ref 0.3–1.2)
BUN: 13 mg/dL (ref 6–23)
CHLORIDE: 107 mmol/L (ref 96–112)
CO2: 21 mmol/L (ref 19–32)
Calcium: 9.3 mg/dL (ref 8.4–10.5)
Creatinine, Ser: 0.59 mg/dL (ref 0.50–1.10)
GFR calc Af Amer: >90 mL/min (ref >90)
GFR calc non Af Amer: >90 mL/min (ref >90)
GLUCOSE: 97 mg/dL (ref 70–99)
POTASSIUM: 3.4 mmol/L — AB (ref 3.5–5.1)
Sodium: 140 mmol/L (ref 135–145)
TOTAL PROTEIN: 7.9 g/dL (ref 6.0–8.3)

## 2015-02-26 LAB — URINALYSIS, ROUTINE W REFLEX MICROSCOPIC
Glucose, UA: NEGATIVE mg/dL
Ketones, ur: >80 mg/dL — AB
Nitrite: NEGATIVE
PROTEIN: 100 mg/dL — AB
Specific Gravity, Urine: 1.03 (ref 1.005–1.030)
Urobilinogen, UA: 1 mg/dL (ref 0.0–1.0)
pH: 7 (ref 5.0–8.0)

## 2015-02-26 LAB — I-STAT BETA HCG BLOOD, ED (MC, WL, AP ONLY)

## 2015-02-26 LAB — POC URINE PREG, ED: PREG TEST UR: NEGATIVE

## 2015-02-26 LAB — LIPASE, BLOOD: Lipase: 32 U/L (ref 11–59)

## 2015-02-26 MED ORDER — PROMETHAZINE HCL 25 MG/ML IJ SOLN
12.5000 mg | Freq: Once | INTRAMUSCULAR | Status: AC
  Administered 2015-02-26: 12.5 mg via INTRAVENOUS
  Filled 2015-02-26: qty 1

## 2015-02-26 MED ORDER — DEXTROSE 5 % IV SOLN
1.0000 g | Freq: Once | INTRAVENOUS | Status: AC
  Administered 2015-02-26: 1 g via INTRAVENOUS
  Filled 2015-02-26: qty 10

## 2015-02-26 MED ORDER — MORPHINE SULFATE 4 MG/ML IJ SOLN
4.0000 mg | Freq: Once | INTRAMUSCULAR | Status: AC
  Administered 2015-02-26: 4 mg via INTRAVENOUS
  Filled 2015-02-26: qty 1

## 2015-02-26 MED ORDER — ONDANSETRON HCL 4 MG PO TABS: 4.0000 mg | ORAL_TABLET | Freq: Four times a day (QID) | ORAL | Status: AC

## 2015-02-26 MED ORDER — SODIUM CHLORIDE 0.9 % IV SOLN
1000.0000 mL | INTRAVENOUS | Status: DC
  Administered 2015-02-26: 1000 mL via INTRAVENOUS

## 2015-02-26 MED ORDER — SODIUM CHLORIDE 0.9 % IV SOLN
1000.0000 mL | Freq: Once | INTRAVENOUS | Status: AC
  Administered 2015-02-26: 1000 mL via INTRAVENOUS

## 2015-02-26 MED ORDER — CEPHALEXIN 500 MG PO CAPS: 500.0000 mg | ORAL_CAPSULE | Freq: Four times a day (QID) | ORAL | Status: AC

## 2015-02-26 MED ORDER — ONDANSETRON HCL 4 MG/2ML IJ SOLN
4.0000 mg | Freq: Once | INTRAMUSCULAR | Status: AC
  Administered 2015-02-26: 4 mg via INTRAVENOUS
  Filled 2015-02-26: qty 2

## 2015-02-26 MED ORDER — HYDROCODONE-ACETAMINOPHEN 5-325 MG PO TABS: 1.0000 | ORAL_TABLET | Freq: Four times a day (QID) | ORAL | Status: AC | PRN

## 2015-02-26 NOTE — ED Notes (Signed)
Patient is still unable to urinate at this time.  

## 2015-02-26 NOTE — ED Notes (Signed)
Per pt, states she was diagnosed with Noro virus on Wed-states not getting better, was told to come here for fluids

## 2015-02-26 NOTE — ED Provider Notes (Signed)
CSN: [id number]     Arrival date & time 02/26/15  1509 History   First MD Initiated Contact with Patient 02/26/15 1614     Chief Complaint  Patient presents with  . Emesis    Patient is a 24 y.o. female presenting with vomiting.  Emesis Associated symptoms: no diarrhea    Pt was diagnosed with noro virus last week.  She has not been feeling any better.  She still is vomiting.  4 times today.  No diarrhea today.  She had been having numerous episodes previously.  Today she started having more cramping as well as pain in her back.  The pain is all over and in both sides. She saw her doctor and was told to come to the ED to get fluids. Past Medical History  Diagnosis Date  . Ovarian cyst    History reviewed. No pertinent past surgical history. No family history on file. History  Substance Use Topics  . Smoking status: Current Every Day Smoker    Types: Cigarettes  . Smokeless tobacco: Not on file  . Alcohol Use: Yes     Comment: occasional   OB History    No data available     Review of Systems  Constitutional: Negative for fever.  Gastrointestinal: Negative for vomiting and diarrhea.  Genitourinary: Negative for dysuria.  All other systems reviewed and are negative.     Allergies  Sulfa antibiotics  Home Medications   Prior to Admission medications   Medication Sig Start Date End Date Taking? Authorizing Provider  citalopram (CELEXA) 40 MG tablet Take 40 mg by mouth daily.   Yes Historical Provider, MD  clonazePAM (KLONOPIN) 0.5 MG tablet Take 0.25 mg by mouth 2 (two) times daily as needed for anxiety (anxiety).   Yes Historical Provider, MD  dicyclomine (BENTYL) 20 MG tablet Take 20 mg by mouth every 6 (six) hours as needed for spasms (cramping).   Yes Historical Provider, MD  HYDROcodone-acetaminophen (NORCO/VICODIN) 5-325 MG per tablet Take 1 tablet by mouth every 6 (six) hours as needed for moderate pain or severe pain. 02/12/15  Yes Marissa Sciacca, PA-C  Multiple  Vitamins-Minerals (MULTIVITAMIN & MINERAL PO) Take 1 tablet by mouth daily.   Yes Historical Provider, MD  norethindrone-ethinyl estradiol (MICROGESTIN,JUNEL,LOESTRIN) 1-20 MG-MCG tablet Take 1 tablet by mouth at bedtime.   Yes Historical Provider, MD  promethazine (PHENERGAN) 25 MG tablet Take 25 mg by mouth every 6 (six) hours as needed for nausea or vomiting (nausea and vomiting).   Yes Historical Provider, MD   BP 128/72 mmHg  Pulse 54  Temp(Src) 98.3 F (36.8 C) (Oral)  Resp 17  SpO2 100% Physical Exam  Constitutional: She appears well-developed and well-nourished. No distress.  HENT:  Head: Normocephalic and atraumatic.  Right Ear: External ear normal.  Left Ear: External ear normal.  Eyes: Conjunctivae are normal. Right eye exhibits no discharge. Left eye exhibits no discharge. No scleral icterus.  Neck: Neck supple. No tracheal deviation present.  Cardiovascular: Normal rate, regular rhythm and intact distal pulses.   Pulmonary/Chest: Effort normal and breath sounds normal. No stridor. No respiratory distress. She has no wheezes. She has no rales.  Abdominal: Soft. Bowel sounds are normal. She exhibits no distension. There is no tenderness. There is no rebound and no guarding.  Musculoskeletal: She exhibits no edema or tenderness.  Neurological: She is alert. She has normal strength. No cranial nerve deficit (no facial droop, extraocular movements intact, no slurred speech) or sensory deficit. She  exhibits normal muscle tone. She displays no seizure activity. Coordination normal.  Skin: Skin is warm and dry. No rash noted.  Psychiatric: She has a normal mood and affect.  Nursing note and vitals reviewed.   ED Course  Procedures (including critical care time) Labs Review Labs Reviewed  CBC WITH DIFFERENTIAL/PLATELET - Abnormal; Notable for the following:    Neutrophils Relative % 79 (*)    Neutro Abs 8.1 (*)    All other components within normal limits  COMPREHENSIVE  METABOLIC PANEL - Abnormal; Notable for the following:    Potassium 3.4 (*)    Total Bilirubin 1.6 (*)    All other components within normal limits  URINALYSIS, ROUTINE W REFLEX MICROSCOPIC - Abnormal; Notable for the following:    Color, Urine AMBER (*)    APPearance CLOUDY (*)    Hgb urine dipstick LARGE (*)    Bilirubin Urine MODERATE (*)    Ketones, ur >80 (*)    Protein, ur 100 (*)    Leukocytes, UA SMALL (*)    All other components within normal limits  URINE MICROSCOPIC-ADD ON - Abnormal; Notable for the following:    Bacteria, UA MANY (*)    All other components within normal limits  LIPASE, BLOOD  POC URINE PREG, ED  I-STAT BETA HCG BLOOD, ED ([facility], [facility], [facility] ONLY)    Medications  0.9 %  sodium chloride infusion (0 mLs Intravenous Stopped 02/26/15 1808)    Followed by  0.9 %  sodium chloride infusion (1,000 mLs Intravenous New Bag/Given 02/26/15 1807)    Followed by  0.9 %  sodium chloride infusion (1,000 mLs Intravenous New Bag/Given 02/26/15 1808)  promethazine (PHENERGAN) injection 12.5 mg (not administered)  cefTRIAXone (ROCEPHIN) 1 g in dextrose 5 % 50 mL IVPB (not administered)  ondansetron (ZOFRAN) injection 4 mg (4 mg Intravenous Given 02/26/15 1703)  morphine 4 MG/ML injection 4 mg (4 mg Intravenous Given 02/26/15 1756)     MDM   Final diagnoses:  UTI (lower urinary tract infection)  Non-intractable vomiting with nausea, vomiting of unspecified type    Patient's urinalysis is consistent with urinary tract infection. Patient has been having pain in her back. It's possible some of her symptoms are related to an early pyelonephritis. Patient's laboratory tests are otherwise reassuring.  She has not had any additional vomiting in the emergency department. Plan on discharge home with oral antibiotics and antinausea medications.   Linwood DibblesJon Brion Sossamon, MD 02/26/15 [phone]

## 2015-02-26 NOTE — ED Notes (Signed)
Patient is not able to urinate and said she does not need a pregnancy done.

## 2015-02-26 NOTE — Discharge Instructions (Signed)

## 2015-02-26 NOTE — ED Notes (Signed)
Pt unable to void at this time. 

## 2015-02-26 NOTE — ED Notes (Signed)
Patient is aware of urine and is unable to go at this time

## 2015-02-27 ENCOUNTER — Emergency Department (HOSPITAL_COMMUNITY)
Admission: EM | Admit: 2015-02-27 | Discharge: 2015-02-27 | Disposition: A | Discharge status: Home / Self Care | Payer: Managed Care, Other (non HMO) | Attending: Emergency Medicine | Admitting: Emergency Medicine

## 2015-02-27 ENCOUNTER — Emergency Department (HOSPITAL_COMMUNITY): Payer: Managed Care, Other (non HMO)

## 2015-02-27 ENCOUNTER — Encounter (HOSPITAL_COMMUNITY): Payer: Self-pay | Admitting: Emergency Medicine

## 2015-02-27 DIAGNOSIS — Z79899 Other long term (current) drug therapy: Secondary | ICD-10-CM | POA: Insufficient documentation

## 2015-02-27 DIAGNOSIS — N8320 Unspecified ovarian cysts: Secondary | ICD-10-CM | POA: Diagnosis not present

## 2015-02-27 DIAGNOSIS — Z720 Tobacco use: Secondary | ICD-10-CM | POA: Insufficient documentation

## 2015-02-27 DIAGNOSIS — F419 Anxiety disorder, unspecified: Secondary | ICD-10-CM | POA: Insufficient documentation

## 2015-02-27 DIAGNOSIS — N83202 Unspecified ovarian cyst, left side: Secondary | ICD-10-CM

## 2015-02-27 DIAGNOSIS — R1032 Left lower quadrant pain: Secondary | ICD-10-CM

## 2015-02-27 DIAGNOSIS — N390 Urinary tract infection, site not specified: Secondary | ICD-10-CM | POA: Diagnosis present

## 2015-02-27 LAB — URINALYSIS, ROUTINE W REFLEX MICROSCOPIC
Bilirubin Urine: NEGATIVE
Glucose, UA: NEGATIVE mg/dL
KETONES UR: 40 mg/dL — AB
LEUKOCYTES UA: NEGATIVE
Nitrite: NEGATIVE
PH: 7.5 (ref 5.0–8.0)
Protein, ur: NEGATIVE mg/dL
Specific Gravity, Urine: 1.007 (ref 1.005–1.030)
Urobilinogen, UA: 0.2 mg/dL (ref 0.0–1.0)

## 2015-02-27 LAB — CBC WITH DIFFERENTIAL/PLATELET
BASOS ABS: 0 10*3/uL (ref 0.0–0.1)
BASOS PCT: 0 % (ref 0–1)
Eosinophils Absolute: 0 10*3/uL (ref 0.0–0.7)
Eosinophils Relative: 0 % (ref 0–5)
HEMATOCRIT: 40.1 % (ref 36.0–46.0)
HEMOGLOBIN: 13.8 g/dL (ref 12.0–15.0)
Lymphocytes Relative: 14 % (ref 12–46)
Lymphs Abs: 1.6 10*3/uL (ref 0.7–4.0)
MCH: 31.4 pg (ref 26.0–34.0)
MCHC: 34.4 g/dL (ref 30.0–36.0)
MCV: 91.3 fL (ref 78.0–100.0)
MONO ABS: 0.6 10*3/uL (ref 0.1–1.0)
Monocytes Relative: 5 % (ref 3–12)
NEUTROS ABS: 9 10*3/uL — AB (ref 1.7–7.7)
Neutrophils Relative %: 81 % — ABNORMAL HIGH (ref 43–77)
Platelets: 277 10*3/uL (ref 150–400)
RBC: 4.39 MIL/uL (ref 3.87–5.11)
RDW: 12.2 % (ref 11.5–15.5)
WBC: 11.2 10*3/uL — ABNORMAL HIGH (ref 4.0–10.5)

## 2015-02-27 LAB — I-STAT BETA HCG BLOOD, ED (MC, WL, AP ONLY): I-stat hCG, quantitative: <5 m[IU]/mL (ref <5)

## 2015-02-27 LAB — BASIC METABOLIC PANEL
Anion gap: 10 (ref 5–15)
BUN: 8 mg/dL (ref 6–23)
CO2: 21 mmol/L (ref 19–32)
CREATININE: 0.66 mg/dL (ref 0.50–1.10)
Calcium: 9.3 mg/dL (ref 8.4–10.5)
Chloride: 108 mmol/L (ref 96–112)
GFR calc Af Amer: >90 mL/min (ref >90)
GFR calc non Af Amer: >90 mL/min (ref >90)
GLUCOSE: 116 mg/dL — AB (ref 70–99)
Potassium: 3.5 mmol/L (ref 3.5–5.1)
Sodium: 139 mmol/L (ref 135–145)

## 2015-02-27 LAB — WET PREP, GENITAL
CLUE CELLS WET PREP: NONE SEEN
Trich, Wet Prep: NONE SEEN
Yeast Wet Prep HPF POC: NONE SEEN

## 2015-02-27 LAB — URINE MICROSCOPIC-ADD ON

## 2015-02-27 MED ORDER — MORPHINE SULFATE 4 MG/ML IJ SOLN
6.0000 mg | Freq: Once | INTRAMUSCULAR | Status: AC
  Administered 2015-02-27: 6 mg via INTRAVENOUS
  Filled 2015-02-27: qty 2

## 2015-02-27 MED ORDER — IOHEXOL 300 MG/ML  SOLN
100.0000 mL | Freq: Once | INTRAMUSCULAR | Status: AC | PRN
  Administered 2015-02-27: 100 mL via INTRAVENOUS

## 2015-02-27 MED ORDER — ONDANSETRON HCL 4 MG/2ML IJ SOLN
4.0000 mg | Freq: Once | INTRAMUSCULAR | Status: AC
  Administered 2015-02-27: 4 mg via INTRAVENOUS
  Filled 2015-02-27: qty 2

## 2015-02-27 MED ORDER — METHOCARBAMOL 500 MG PO TABS: 500.0000 mg | ORAL_TABLET | Freq: Two times a day (BID) | ORAL | Status: AC

## 2015-02-27 MED ORDER — KETOROLAC TROMETHAMINE 30 MG/ML IJ SOLN
30.0000 mg | Freq: Once | INTRAMUSCULAR | Status: AC
  Administered 2015-02-27: 30 mg via INTRAVENOUS
  Filled 2015-02-27: qty 1

## 2015-02-27 NOTE — ED Notes (Signed)
Pt seen yesterday for UTI. Pt took one does of cipro and vicodin at 11am with no relief. Pt now c/o of increased pain. Pt started to become anxious when arrived at ER by EMS. States out of her klonopin.

## 2015-02-27 NOTE — ED Provider Notes (Signed)
CSN: [id number]     Arrival date & time 02/27/15  1306 History   First MD Initiated Contact with Patient 02/27/15 1504     Chief Complaint  Patient presents with  . Urinary Tract Infection  . Anxiety     (Consider location/radiation/quality/duration/timing/severity/associated sxs/prior Treatment) HPI  24 year old female who reports for the past 4 days she has had persistent vomiting, abdominal and lower back cramping who was seen in the ER yesterday and was diagnosed with having a urinary tract infection. She was prescribed Cipro and Vicodin and was discharged home. Patient states that her back pain and her abdominal pain has been persistent, progressively worsening, described as sharp and crampy, nothing makes it better or worse. States pain felt similar to prior ovarian cyst in which she was diagnosed 3 weeks ago. She did endorse nausea and vomiting but that has improved. She did try eating a sandwich yesterday which did not worsen her sxs.  She denies fever but endorse chills.  Report she is currently on her menstruation.  Denies prior hx of kidney stone.  Patient is sexually active, no prior history of STDs. She also has history of panic attack and states her pain is worsening her panic attack. She is without her Klonopin.  Past Medical History  Diagnosis Date  . Ovarian cyst    History reviewed. No pertinent past surgical history. History reviewed. No pertinent family history. History  Substance Use Topics  . Smoking status: Current Every Day Smoker    Types: Cigarettes  . Smokeless tobacco: Not on file  . Alcohol Use: Yes     Comment: occasional   OB History    No data available     Review of Systems  All other systems reviewed and are negative.     Allergies  Sulfa antibiotics  Home Medications   Prior to Admission medications   Medication Sig Start Date End Date Taking? Authorizing Provider  bismuth subsalicylate (PEPTO BISMOL) 262 MG/15ML suspension Take 30 mLs  by mouth every 6 (six) hours as needed for indigestion (nausea).   Yes Historical Provider, MD  cephALEXin (KEFLEX) 500 MG capsule Take 1 capsule (500 mg total) by mouth 4 (four) times daily. 02/26/15  Yes Linwood DibblesJon Knapp, MD  citalopram (CELEXA) 40 MG tablet Take 40 mg by mouth daily.   Yes Historical Provider, MD  clonazePAM (KLONOPIN) 0.5 MG tablet Take 0.25 mg by mouth 2 (two) times daily as needed for anxiety (anxiety).   Yes Historical Provider, MD  dicyclomine (BENTYL) 20 MG tablet Take 20 mg by mouth every 6 (six) hours as needed for spasms (cramping).   Yes Historical Provider, MD  HYDROcodone-acetaminophen (NORCO/VICODIN) 5-325 MG per tablet Take 1 tablet by mouth every 6 (six) hours as needed for moderate pain or severe pain. 02/26/15  Yes Linwood DibblesJon Knapp, MD  Multiple Vitamins-Minerals (MULTIVITAMIN & MINERAL PO) Take 1 tablet by mouth daily.   Yes Historical Provider, MD  norethindrone-ethinyl estradiol (MICROGESTIN,JUNEL,LOESTRIN) 1-20 MG-MCG tablet Take 1 tablet by mouth at bedtime.   Yes Historical Provider, MD  ondansetron (ZOFRAN) 4 MG tablet Take 1 tablet (4 mg total) by mouth every 6 (six) hours. 02/26/15  Yes Linwood DibblesJon Knapp, MD  promethazine (PHENERGAN) 25 MG tablet Take 25 mg by mouth every 6 (six) hours as needed for nausea or vomiting (nausea and vomiting).   Yes Historical Provider, MD   BP 140/89 mmHg  Pulse 71  Temp(Src) 98.2 F (36.8 C) (Oral)  Resp 20  SpO2 100% Physical Exam  Constitutional: She appears well-developed and well-nourished. No distress.  HENT:  Head: Atraumatic.  Eyes: Conjunctivae are normal.  Neck: Neck supple.  Cardiovascular: Normal rate and regular rhythm.   Pulmonary/Chest: Effort normal and breath sounds normal.  Abdominal: Soft. Bowel sounds are normal. She exhibits no distension. There is tenderness (Suprapubic tenderness and tenderness to left lower quadrant on palpation without guarding or rebound tenderness. Negative Murphy sign, no pain at McBurney's point.  No peritoneal sign.).  No CVA tenderness.  Genitourinary:  Chaperone present during the exam: No inguinal hernia or inguinal lymphadenopathy noted. Normal external genitalia. No significant pain with speculum insertion. Soft cup located and appears to be in place. Small amount of vaginal bleeding noted in vaginal vault without discharge. Cervical os is visualized and closed. On bimanual examination, no adnexal tenderness and no cervical motion tenderness. No mass noted.  Neurological: She is alert.  Skin: No rash noted.  Psychiatric: She has a normal mood and affect.  Nursing note and vitals reviewed.   ED Course  Procedures (including critical care time)  4:59 PM Patient with low abdominal pain, with low back pain recently diagnosed with having a UTI. She is currently on her menstruation. She has worsening lower abdominal pain today. Her pain improves after receiving pain medication. Her abdominal exam and a pelvic examination is unremarkable. However, patient is concerned and request for CT scan. Patient understanding the risks and benefits of CT scan. Scan ordered per request.  8:35 PM Patient is afebrile, vital signs stable, labs are reassuring, wet prep shows no signs of infection, abdominal and pelvis CT scan so essentially normal with evidence of ovarian cyst only. Patient continues to endorse abdominal pain, graded as 8 out of 10. Plan to give Toradol for pain management. Otherwise recommend close follow-up with PCP for outpatient evaluation. I also recommend patient to continue with her antibiotic for the full duration. I will also prescribe muscle relaxant as requested. Return precautions discussed.  Labs Review Labs Reviewed  WET PREP, GENITAL - Abnormal; Notable for the following:    WBC, Wet Prep HPF POC FEW (*)    All other components within normal limits  URINALYSIS, ROUTINE W REFLEX MICROSCOPIC - Abnormal; Notable for the following:    Hgb urine dipstick LARGE (*)     Ketones, ur 40 (*)    All other components within normal limits  CBC WITH DIFFERENTIAL/PLATELET - Abnormal; Notable for the following:    WBC 11.2 (*)    Neutrophils Relative % 81 (*)    Neutro Abs 9.0 (*)    All other components within normal limits  BASIC METABOLIC PANEL - Abnormal; Notable for the following:    Glucose, Bld 116 (*)    All other components within normal limits  URINE CULTURE  URINE MICROSCOPIC-ADD ON  I-STAT BETA HCG BLOOD, ED ([facility], [facility], [facility] ONLY)  GC/CHLAMYDIA PROBE AMP ([place])    Imaging Review Ct Abdomen Pelvis W Contrast  02/27/2015   CLINICAL DATA:  Low abdominal pain for 3 days.  Nausea, vomiting  EXAM: CT ABDOMEN AND PELVIS WITH CONTRAST  TECHNIQUE: Multidetector CT imaging of the abdomen and pelvis was performed using the standard protocol following bolus administration of intravenous contrast.  CONTRAST:  OMNIPAQUE IOHEXOL 300 MG/ML  SOLN  COMPARISON:  None.  FINDINGS: The lung bases are clear.  13 mm hypodense lesion in the liver with peripheral nodular enhancement most compatible with a small hemangioma. The liver is otherwise unremarkable. There is no intrahepatic  or extrahepatic biliary ductal dilatation. The gallbladder is normal. The spleen demonstrates no focal abnormality. The kidneys, adrenal glands and pancreas are normal. The bladder is unremarkable.  The stomach, duodenum, small intestine, and large intestine demonstrate no contrast extravasation or dilatation. There is a normal caliber appendix in the right lower quadrant without periappendiceal inflammatory changes. There is no pneumoperitoneum, pneumatosis, or portal venous gas. There is no abdominal or pelvic free fluid. There is no lymphadenopathy. There is a 4.9 x 3.4 cm cystic mass which appears to be a adjacent to the left ovary which likely reflect a paraovarian cyst.  The abdominal aorta is normal in caliber.  There are no lytic or sclerotic osseous lesions. There is a mild levocurvature of  the lumbar spine.  IMPRESSION: 1. No acute abdominal or pelvic pathology. 2. Left para ovarian cyst.   Electronically Signed   By: Elige Ko   On: 02/27/2015 19:31     EKG Interpretation None      MDM   Final diagnoses:  Left lower quadrant pain  Cyst of left ovary    BP 144/76 mmHg  Pulse 86  Temp(Src) 98.2 F (36.8 C) (Oral)  Resp 20  SpO2 100%  LMP   I have reviewed nursing notes and vital signs. I personally reviewed the imaging tests through PACS system  I reviewed available ER/hospitalization records thought the EMR     Fayrene Helper, PA-C 02/27/15 2038  Linwood Dibbles, MD 02/27/15 2046

## 2015-02-27 NOTE — Discharge Instructions (Signed)
Please follow up closely with your primary care provider for further evaluation of your abdominal pain.  Abdominal Pain, Women Abdominal (stomach, pelvic, or belly) pain can be caused by many things. It is important to tell your doctor:  The location of the pain.  Does it come and go or is it present all the time?  Are there things that start the pain (eating certain foods, exercise)?  Are there other symptoms associated with the pain (fever, nausea, vomiting, diarrhea)? All of this is helpful to know when trying to find the cause of the pain. CAUSES   Stomach: virus or bacteria infection, or ulcer.  Intestine: appendicitis (inflamed appendix), regional ileitis (Crohn's disease), ulcerative colitis (inflamed colon), irritable bowel syndrome, diverticulitis (inflamed diverticulum of the colon), or cancer of the stomach or intestine.  Gallbladder disease or stones in the gallbladder.  Kidney disease, kidney stones, or infection.  Pancreas infection or cancer.  Fibromyalgia (pain disorder).  Diseases of the female organs:  Uterus: fibroid (non-cancerous) tumors or infection.  Fallopian tubes: infection or tubal pregnancy.  Ovary: cysts or tumors.  Pelvic adhesions (scar tissue).  Endometriosis (uterus lining tissue growing in the pelvis and on the pelvic organs).  Pelvic congestion syndrome (female organs filling up with blood just before the menstrual period).  Pain with the menstrual period.  Pain with ovulation (producing an egg).  Pain with an IUD (intrauterine device, birth control) in the uterus.  Cancer of the female organs.  Functional pain (pain not caused by a disease, may improve without treatment).  Psychological pain.  Depression. DIAGNOSIS  Your doctor will decide the seriousness of your pain by doing an examination.  Blood tests.  X-rays.  Ultrasound.  CT scan (computed tomography, special type of X-ray).  MRI (magnetic resonance  imaging).  Cultures, for infection.  Barium enema (dye inserted in the large intestine, to better view it with X-rays).  Colonoscopy (looking in intestine with a lighted tube).  Laparoscopy (minor surgery, looking in abdomen with a lighted tube).  Major abdominal exploratory surgery (looking in abdomen with a large incision). TREATMENT  The treatment will depend on the cause of the pain.   Many cases can be observed and treated at home.  Over-the-counter medicines recommended by your caregiver.  Prescription medicine.  Antibiotics, for infection.  Birth control pills, for painful periods or for ovulation pain.  Hormone treatment, for endometriosis.  Nerve blocking injections.  Physical therapy.  Antidepressants.  Counseling with a psychologist or psychiatrist.  Minor or major surgery. HOME CARE INSTRUCTIONS   Do not take laxatives, unless directed by your caregiver.  Take over-the-counter pain medicine only if ordered by your caregiver. Do not take aspirin because it can cause an upset stomach or bleeding.  Try a clear liquid diet (broth or water) as ordered by your caregiver. Slowly move to a bland diet, as tolerated, if the pain is related to the stomach or intestine.  Have a thermometer and take your temperature several times a day, and record it.  Bed rest and sleep, if it helps the pain.  Avoid sexual intercourse, if it causes pain.  Avoid stressful situations.  Keep your follow-up appointments and tests, as your caregiver orders.  If the pain does not go away with medicine or surgery, you may try:  Acupuncture.  Relaxation exercises (yoga, meditation).  Group therapy.  Counseling. SEEK MEDICAL CARE IF:   You notice certain foods cause stomach pain.  Your home care treatment is not helping your pain.  You need stronger pain medicine.  You want your IUD removed.  You feel faint or lightheaded.  You develop nausea and vomiting.  You  develop a rash.  You are having side effects or an allergy to your medicine. SEEK IMMEDIATE MEDICAL CARE IF:   Your pain does not go away or gets worse.  You have a fever.  Your pain is felt only in portions of the abdomen. The right side could possibly be appendicitis. The left lower portion of the abdomen could be colitis or diverticulitis.  You are passing blood in your stools (bright red or black tarry stools, with or without vomiting).  You have blood in your urine.  You develop chills, with or without a fever.  You pass out. MAKE SURE YOU:   Understand these instructions.  Will watch your condition.  Will get help right away if you are not doing well or get worse. Document Released: 10/05/2007 Document Revised: 04/24/2014 Document Reviewed: 10/25/2009 [facility] Patient Information 2015 [place], [place]. This information is not intended to replace advice given to you by your health care provider. Make sure you discuss any questions you have with your health care provider.

## 2015-02-28 LAB — GC/CHLAMYDIA PROBE AMP (~~LOC~~) NOT AT ARMC
CHLAMYDIA, DNA PROBE: NEGATIVE
Neisseria Gonorrhea: NEGATIVE

## 2015-03-01 LAB — URINE CULTURE
COLONY COUNT: NO GROWTH
Culture: NO GROWTH

## 2015-07-07 IMAGING — US US ART/VEN ABD/PELV/SCROTUM DOPPLER LTD
1 series · 13 of 25 positions shown · non-contrast
Comparison: None.

CLINICAL DATA: Pelvic pain mostly in the left lower quadrant.
Positive urine pregnancy test. Quantitative beta HCG is less than 1.
Estimated gestational age by LMP is 7 weeks 1 day.



[Series 1: us art/ven abd/pelv/scrotum doppler ltd · 0.22mm/px · 13 of 83 slices shown]
[im 1/83]
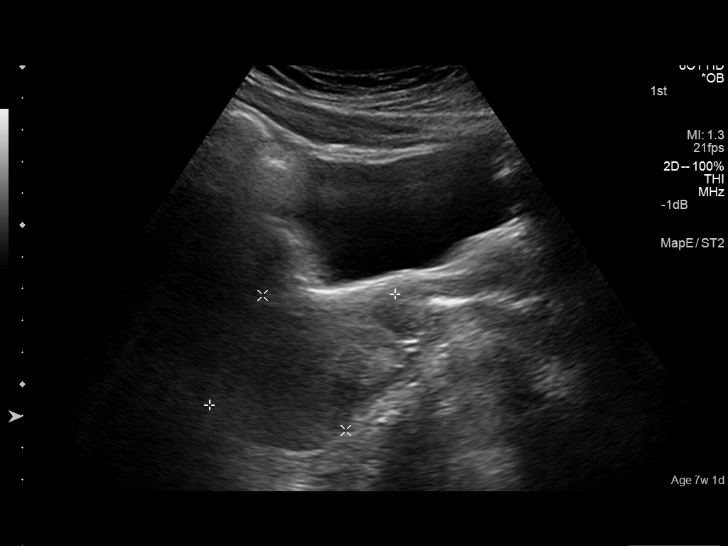
[im 7/83]
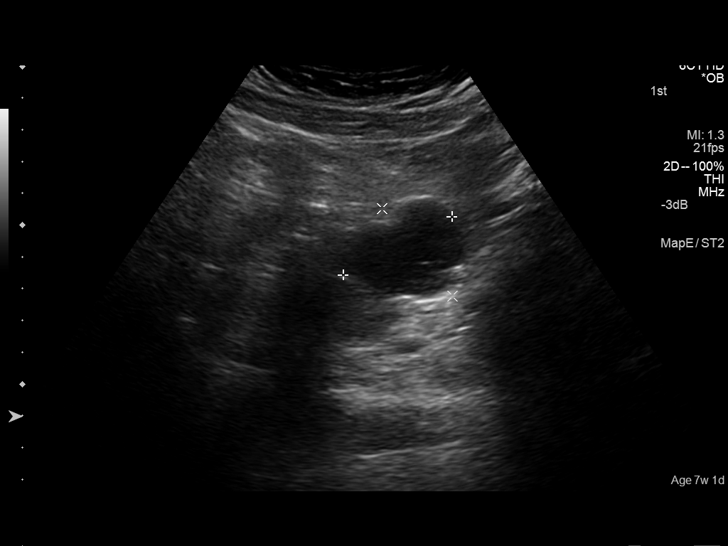
[im 14/83]
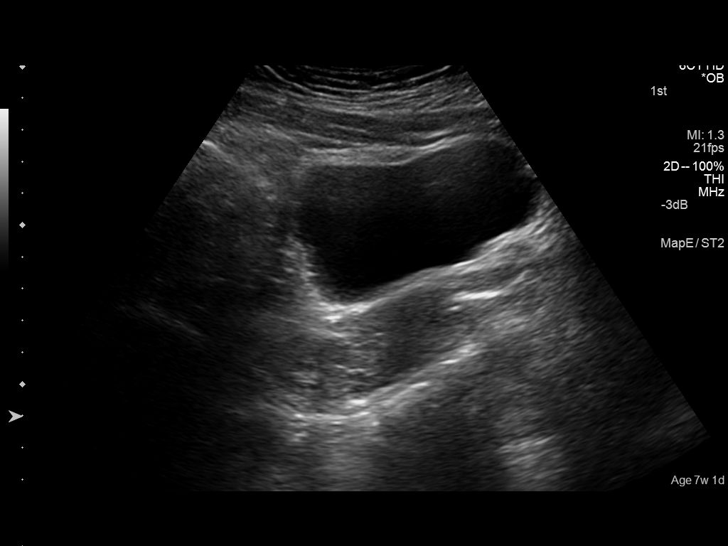
[im 21/83]
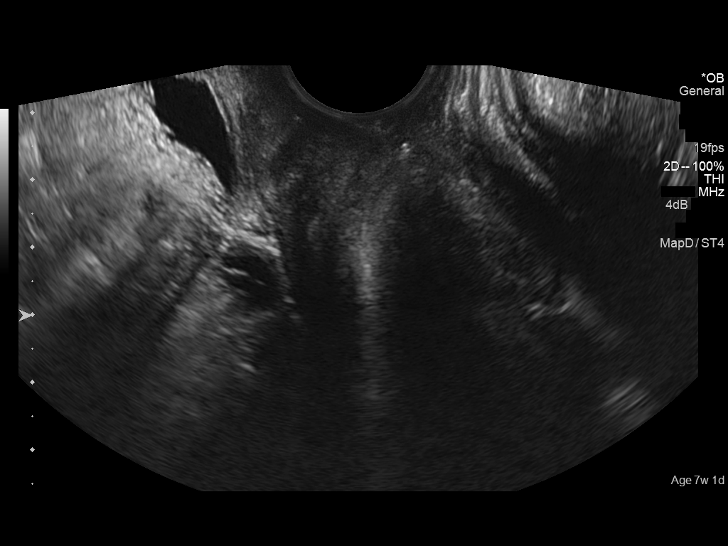
[im 28/83]
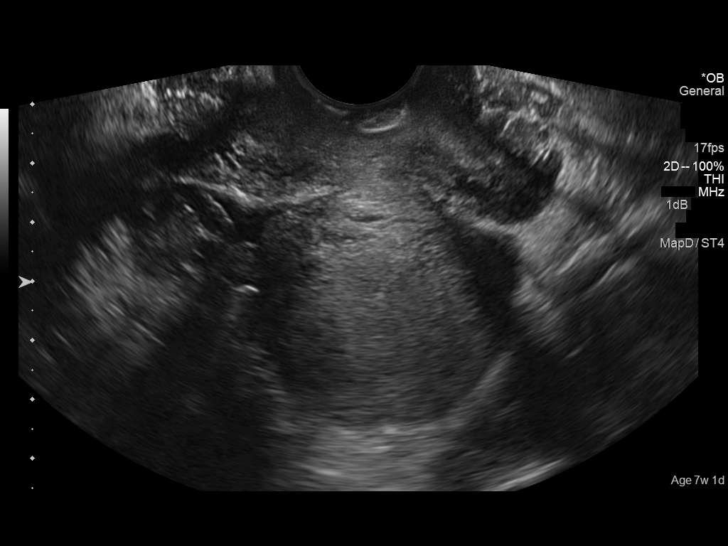
[im 35/83]
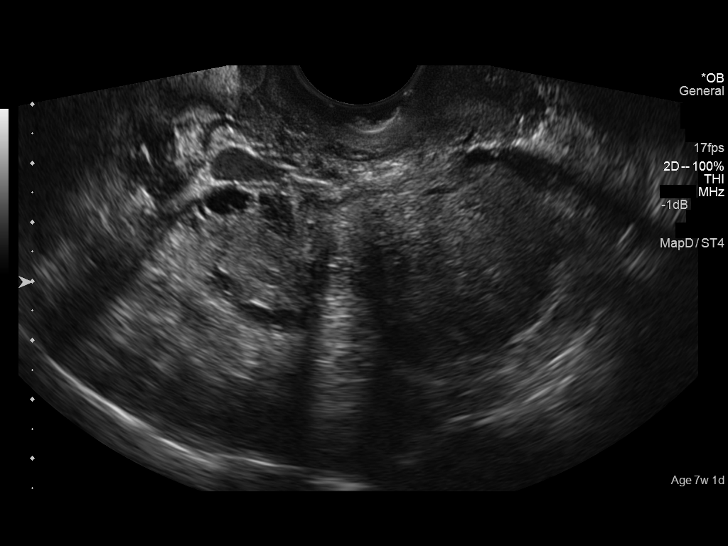
[im 42/83]
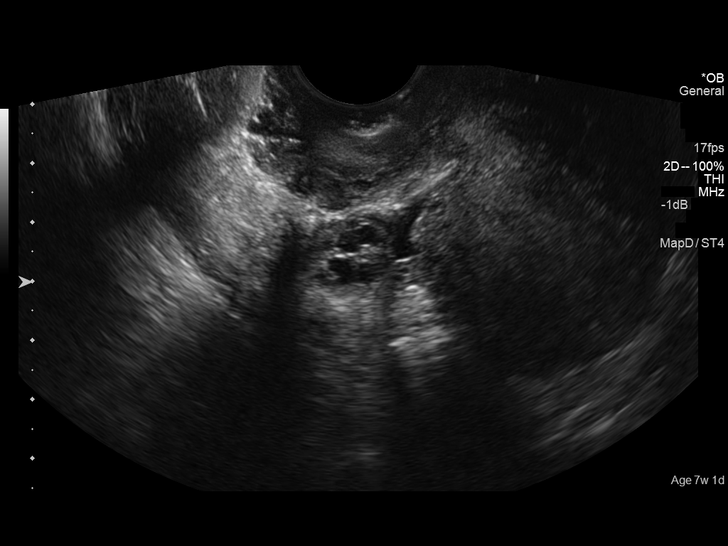
[im 48/83]
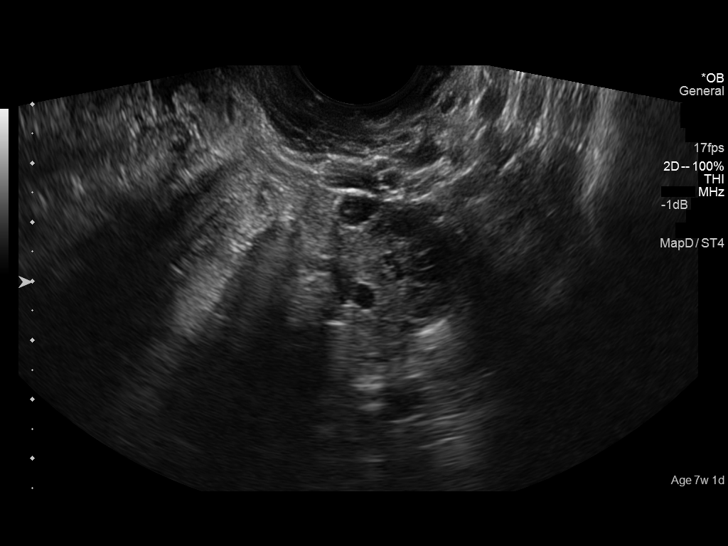
[im 55/83]
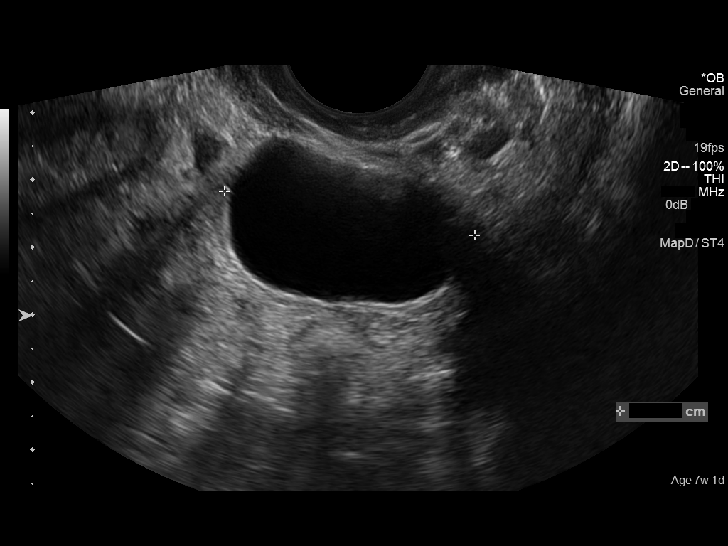
[im 62/83]
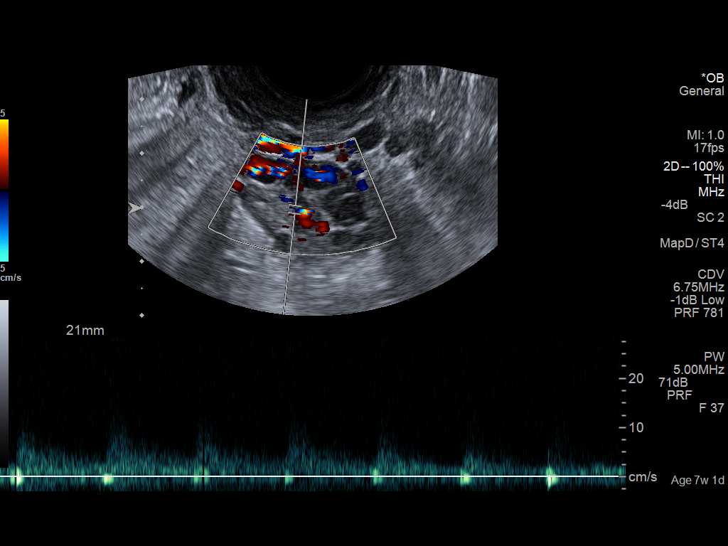
[im 69/83]
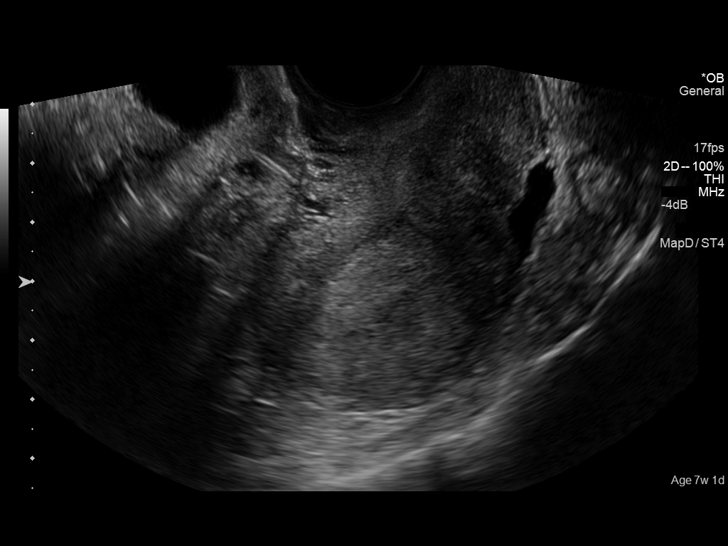
[im 76/83]
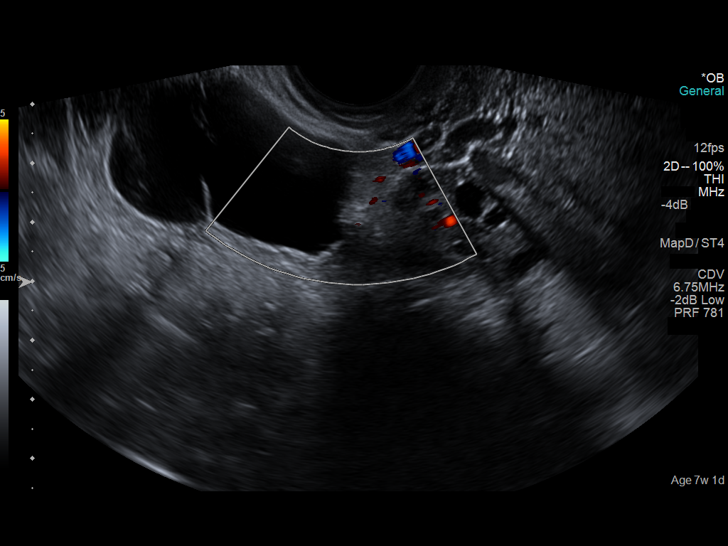
[im 83/83]
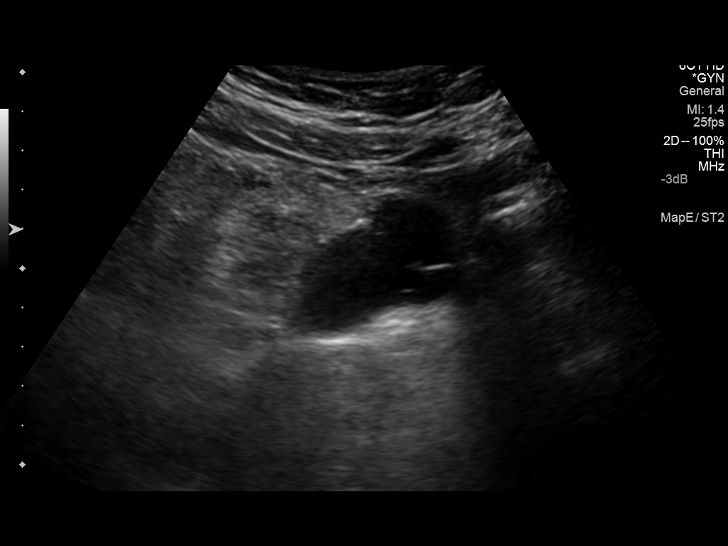

[13 of 25 positions shown; findings below may reference images not displayed]

FINDINGS: Intrauterine gestational sac: No intrauterine pregnancy identified.

Yolk sac:  Not identified.

Embryo:  Not identified.

Cardiac Activity: Not identified.

Maternal uterus/adnexae: Uterus is retroflexed. No myometrial mass
lesions. Endometrial stripe thickness measures 1.1 cm. Normal
appearance.

Right ovary measures 3.2 x 2 x 2 cm. Normal follicular changes.
Focal complex lesion measuring about 1.4 cm diameter probably
represents an involuting cyst.

Left ovary measures 3.4 x 2.1 x 2.3 cm. There is a septated cyst in
the left adnexae adjacent to the ovary measuring 4.2 x 3.1 x 3.8 cm.
Thin septations are demonstrated without nodularity or flow. This
likely represents a paraovarian cyst. No suspicious features for
malignancy.

Pulsed Doppler evaluation of both ovaries demonstrates normal
appearing low-resistance arterial and venous waveforms. Flow is
demonstrated in both ovaries on color flow Doppler imaging.

Small amount of free fluid in the pelvis.
IMPRESSION: No intrauterine gestational sac, yolk sac, or fetal pole identified.
Differential considerations include intrauterine pregnancy too early
to be sonographically visualized, missed abortion, or ectopic
pregnancy. Correlate with serial quantitative beta HCG levels.
Followup ultrasound is recommended in 10-14 days for further
evaluation if indicated. No evidence of ovarian torsion. Septated
paraovarian cyst on the left without specific malignant features.

## 2015-07-22 IMAGING — CT CT ABD-PELV W/ CM
1 of 2 series · 15 of 32 positions shown, 19 images · IV contrast (OMNIPAQUE 300)
Comparison: None.

CLINICAL DATA: Low abdominal pain for 3 days.  Nausea, vomiting

EXAM:
CT ABDOMEN AND PELVIS WITH CONTRAST
TECHNIQUE: Multidetector CT imaging of the abdomen and pelvis was performed
using the standard protocol following bolus administration of
intravenous contrast.
CONTRAST:  100mL OMNIPAQUE IOHEXOL 300 MG/ML  SOLN

[Series 2: abd/pel with · axial · 0.63mm/px · z∈[-514,-160]mm · 15 of 79 slices shown, 19 images]
[im 4/79  soft-tissue]
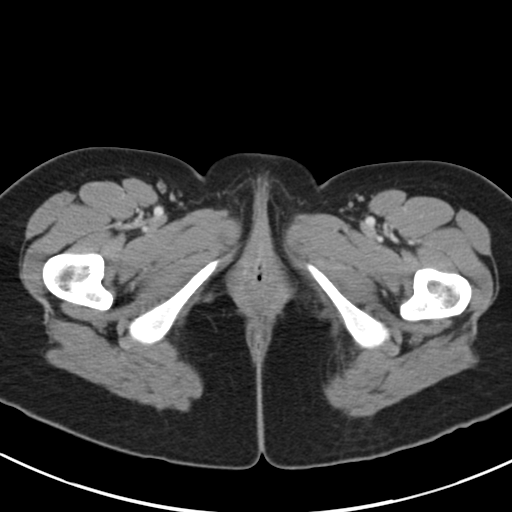
[im 4/79  bone]
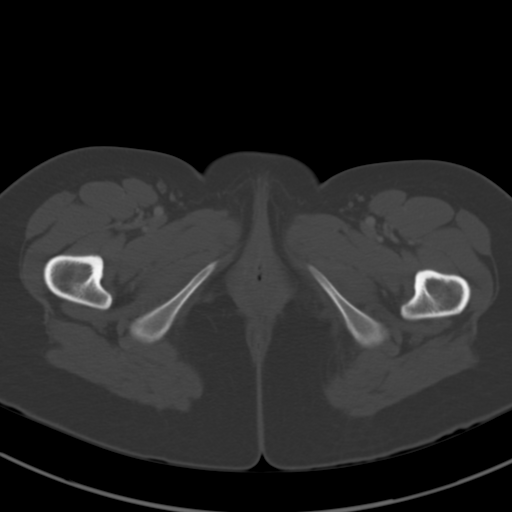
[im 10/79  soft-tissue]
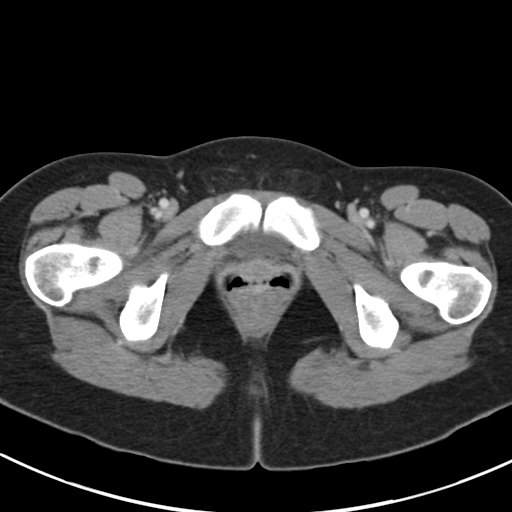
[im 17/79  soft-tissue]
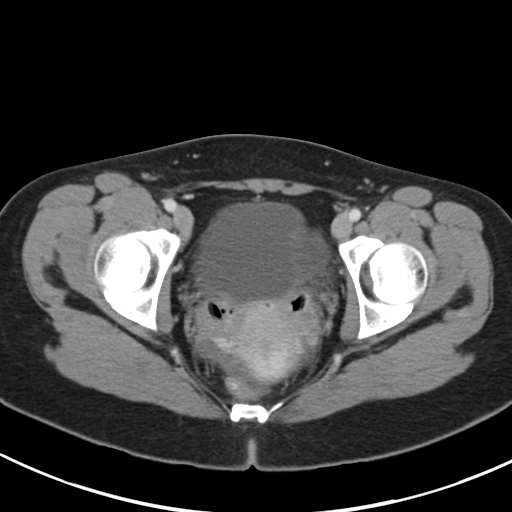
[im 23/79  soft-tissue]
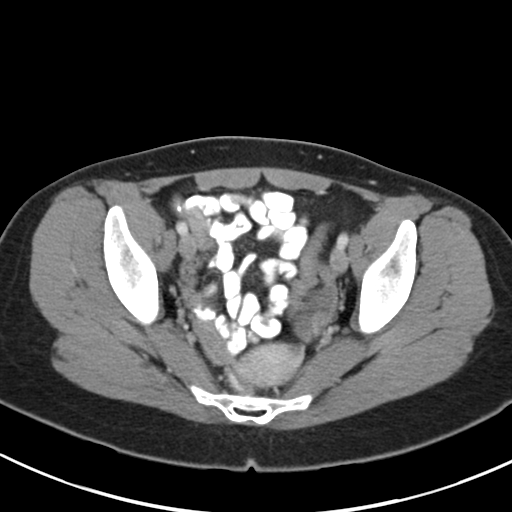
[im 27/79  soft-tissue]
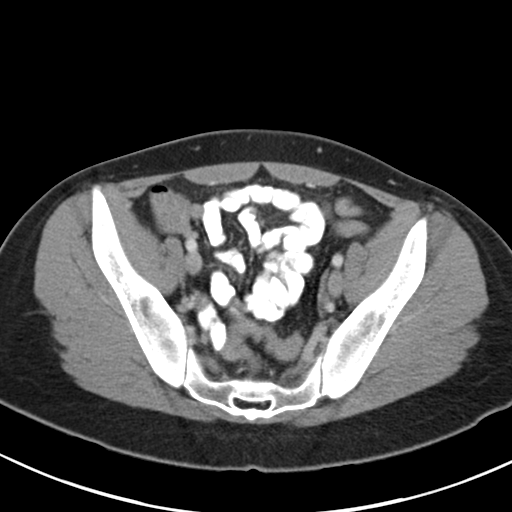
[im 33/79  soft-tissue]
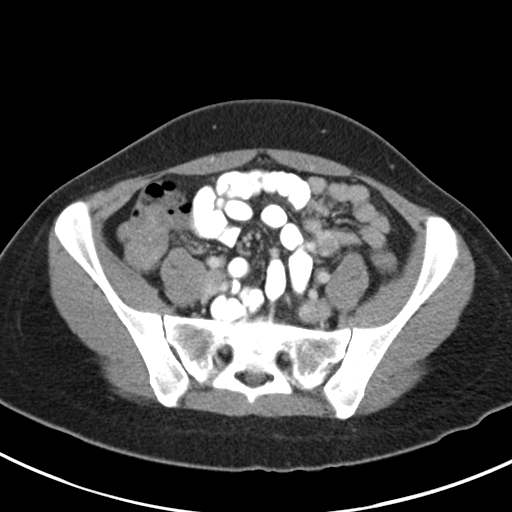
[im 40/79  soft-tissue]
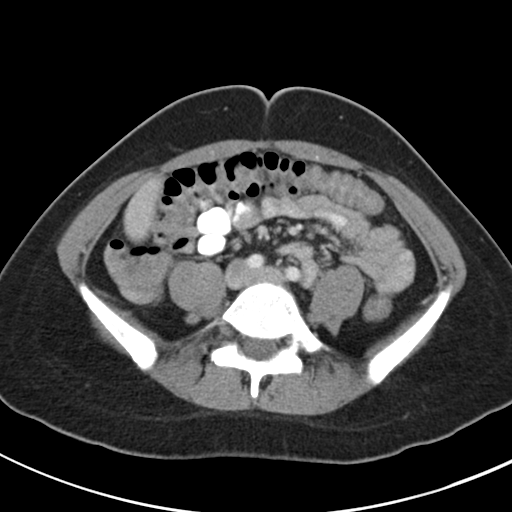
[im 46/79  soft-tissue]
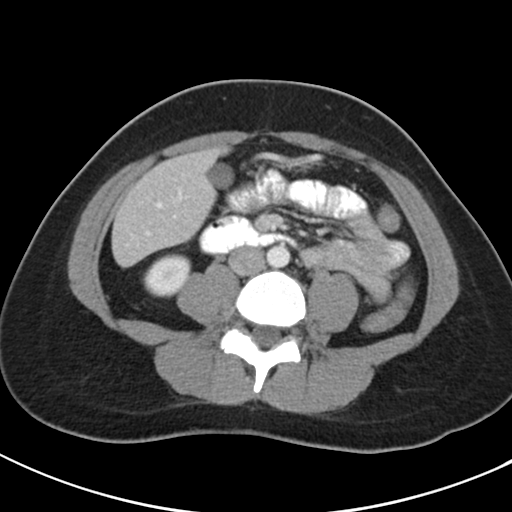
[im 53/79  soft-tissue]
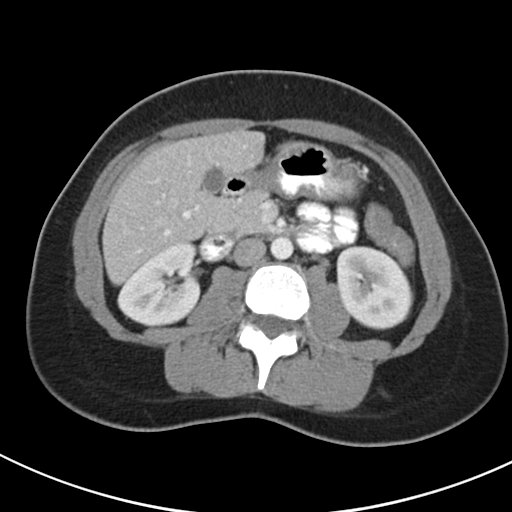
[im 53/79  bone]
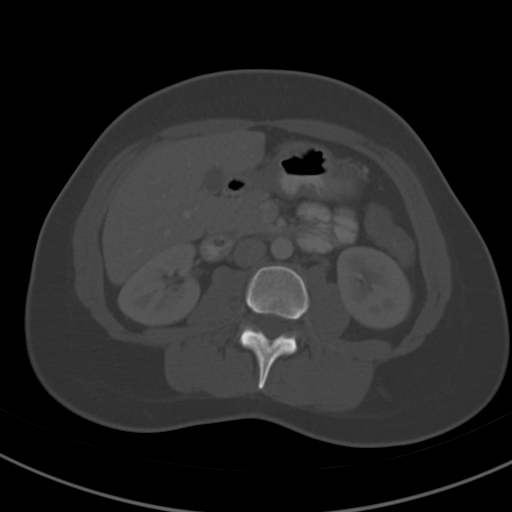
[im 56/79  soft-tissue]
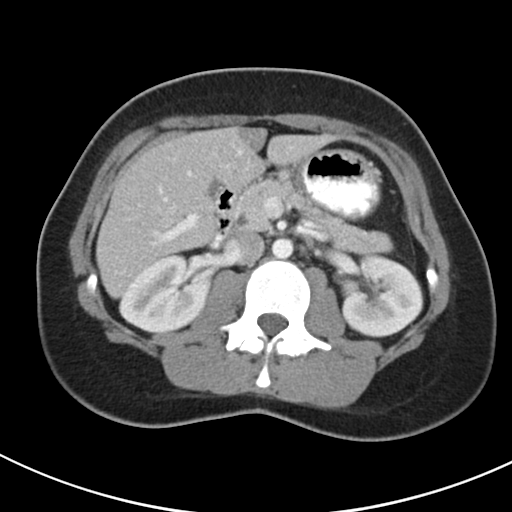
[im 62/79  soft-tissue]
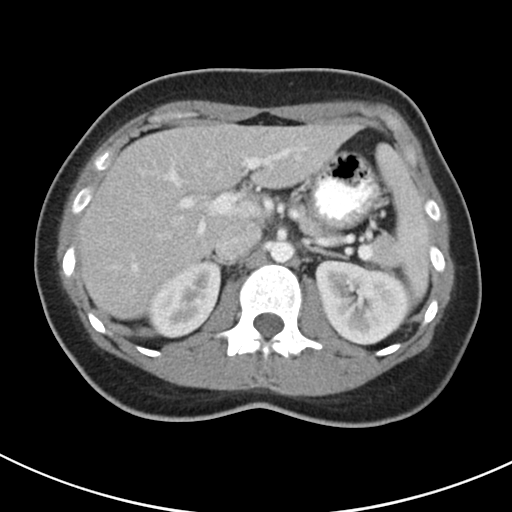
[im 66/79  lung]
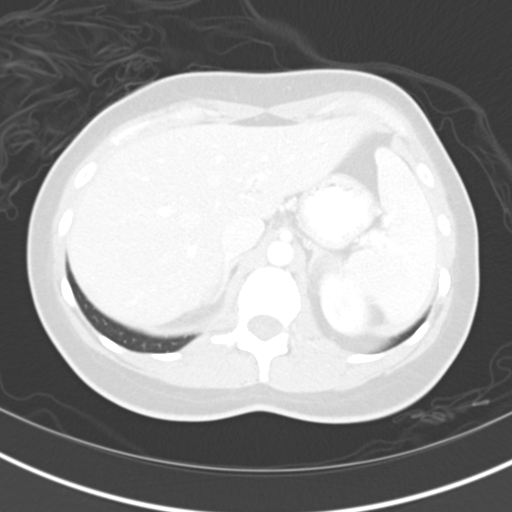
[im 69/79  soft-tissue]
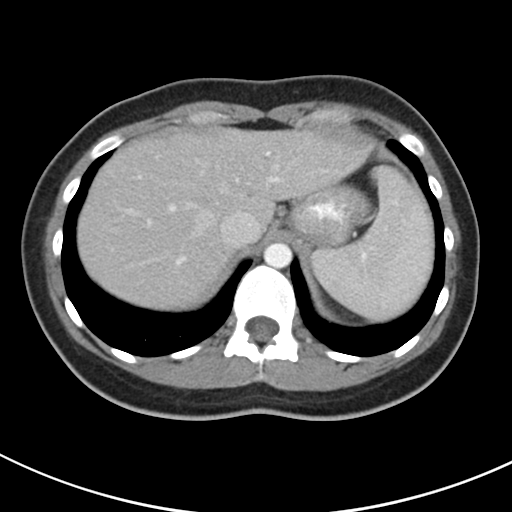
[im 69/79  lung]
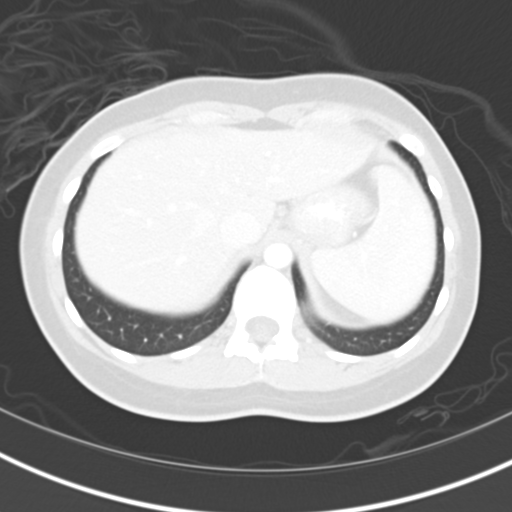
[im 72/79  lung]
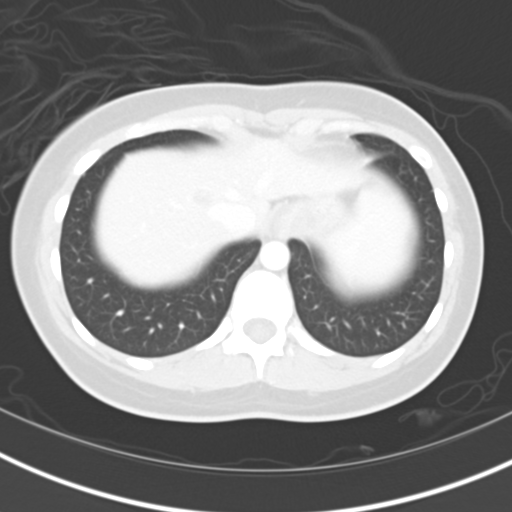
[im 75/79  soft-tissue]
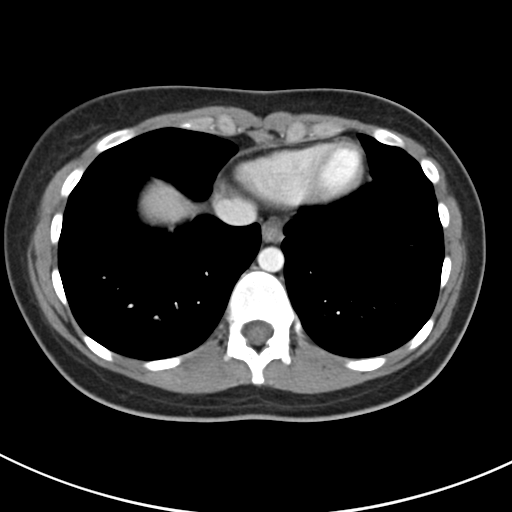
[im 75/79  lung]
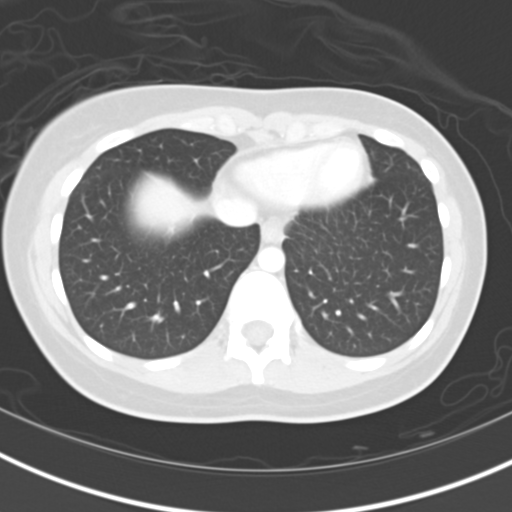

[15 of 32 positions shown; findings below may reference images not displayed]

FINDINGS: The lung bases are clear.

13 mm hypodense lesion in the liver with peripheral nodular
enhancement most compatible with a small hemangioma. The liver is
otherwise unremarkable. There is no intrahepatic or extrahepatic
biliary ductal dilatation. The gallbladder is normal. The spleen
demonstrates no focal abnormality. The kidneys, adrenal glands and
pancreas are normal. The bladder is unremarkable.

The stomach, duodenum, small intestine, and large intestine
demonstrate no contrast extravasation or dilatation. There is a
normal caliber appendix in the right lower quadrant without
periappendiceal inflammatory changes. There is no pneumoperitoneum,
pneumatosis, or portal venous gas. There is no abdominal or pelvic
free fluid. There is no lymphadenopathy. There is a 4.9 x 3.4 cm
cystic mass which appears to be a adjacent to the left ovary which
likely reflect a paraovarian cyst.

The abdominal aorta is normal in caliber.

There are no lytic or sclerotic osseous lesions. There is a mild
levocurvature of the lumbar spine.
IMPRESSION: 1. No acute abdominal or pelvic pathology.
2. Left para ovarian cyst.
